# Patient Record
Sex: Female | Born: 1995
Health system: Southern US, Community
[De-identification: ages and names within clinical notes are randomized; demographics above are authoritative.]

## PROBLEM LIST (undated history)

## (undated) DIAGNOSIS — F32A Depression, unspecified: Secondary | ICD-10-CM

## (undated) DIAGNOSIS — F909 Attention-deficit hyperactivity disorder, unspecified type: Secondary | ICD-10-CM

## (undated) DIAGNOSIS — F419 Anxiety disorder, unspecified: Secondary | ICD-10-CM

## (undated) DIAGNOSIS — F329 Major depressive disorder, single episode, unspecified: Secondary | ICD-10-CM

## (undated) HISTORY — DX: Depression, unspecified: F32.A

## (undated) HISTORY — DX: Anxiety disorder, unspecified: F41.9

## (undated) HISTORY — DX: Major depressive disorder, single episode, unspecified: F32.9

## (undated) HISTORY — DX: Attention-deficit hyperactivity disorder, unspecified type: F90.9

---
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
## 2016-07-31 ENCOUNTER — Encounter: Payer: Self-pay | Admitting: Emergency Medicine

## 2016-07-31 ENCOUNTER — Emergency Department: Payer: Managed Care, Other (non HMO)

## 2016-07-31 ENCOUNTER — Emergency Department
Admission: EM | Admit: 2016-07-31 | Discharge: 2016-07-31 | Disposition: A | Discharge status: Home / Self Care | Payer: Managed Care, Other (non HMO) | Attending: Emergency Medicine | Admitting: Emergency Medicine

## 2016-07-31 DIAGNOSIS — Y929 Unspecified place or not applicable: Secondary | ICD-10-CM | POA: Diagnosis not present

## 2016-07-31 DIAGNOSIS — Z791 Long term (current) use of non-steroidal anti-inflammatories (NSAID): Secondary | ICD-10-CM | POA: Diagnosis not present

## 2016-07-31 DIAGNOSIS — Y9389 Activity, other specified: Secondary | ICD-10-CM | POA: Insufficient documentation

## 2016-07-31 DIAGNOSIS — Y999 Unspecified external cause status: Secondary | ICD-10-CM | POA: Diagnosis not present

## 2016-07-31 DIAGNOSIS — S62521A Displaced fracture of distal phalanx of right thumb, initial encounter for closed fracture: Secondary | ICD-10-CM | POA: Insufficient documentation

## 2016-07-31 DIAGNOSIS — S61308A Unspecified open wound of other finger with damage to nail, initial encounter: Secondary | ICD-10-CM | POA: Diagnosis not present

## 2016-07-31 DIAGNOSIS — W230XXA Caught, crushed, jammed, or pinched between moving objects, initial encounter: Secondary | ICD-10-CM | POA: Diagnosis not present

## 2016-07-31 DIAGNOSIS — S61309A Unspecified open wound of unspecified finger with damage to nail, initial encounter: Secondary | ICD-10-CM

## 2016-07-31 DIAGNOSIS — S6991XA Unspecified injury of right wrist, hand and finger(s), initial encounter: Secondary | ICD-10-CM

## 2016-07-31 MED ORDER — OXYCODONE-ACETAMINOPHEN 5-325 MG PO TABS
1.0000 | ORAL_TABLET | Freq: Once | ORAL | Status: AC
Start: 2016-07-31 — End: 2016-07-31
  Administered 2016-07-31: 1 via ORAL
  Filled 2016-07-31: qty 1

## 2016-07-31 MED ORDER — OXYCODONE-ACETAMINOPHEN 5-325 MG PO TABS
1.0000 | ORAL_TABLET | Freq: Once | ORAL | Status: AC
  Administered 2016-07-31: 1 via ORAL
  Filled 2016-07-31: qty 1

## 2016-07-31 MED ORDER — OXYCODONE-ACETAMINOPHEN 5-325 MG PO TABS
1.0000 | ORAL_TABLET | Freq: Four times a day (QID) | ORAL | 0 refills | Status: DC | PRN
Start: 2016-07-31 — End: 2018-05-10

## 2016-07-31 MED ORDER — LIDOCAINE HCL (PF) 1 % IJ SOLN
10.0000 mL | Freq: Once | INTRAMUSCULAR | Status: AC
  Administered 2016-07-31: 10 mL
  Filled 2016-07-31: qty 10

## 2016-07-31 MED ORDER — LIDOCAINE-EPINEPHRINE-TETRACAINE (LET) SOLUTION
3.0000 mL | Freq: Once | NASAL | Status: AC
  Administered 2016-07-31: 3 mL via TOPICAL

## 2016-07-31 MED ORDER — LIDOCAINE-EPINEPHRINE-TETRACAINE (LET) SOLUTION
NASAL | Status: AC
  Administered 2016-07-31: 3 mL via TOPICAL
  Filled 2016-07-31: qty 3

## 2016-07-31 NOTE — ED Triage Notes (Signed)
Slammed right thumb in car door. Pt anxious in triage. Thumb nail to right hand appears to be off. Bleeding.

## 2016-07-31 NOTE — ED Notes (Signed)
Pt splint was applied. Pt was given education about splint and what adverse side affects to look for from splinting process. Mother in room verbalized understanding. Pt pulse and cap.refill within normal limits. RN notifed of completion

## 2016-07-31 NOTE — ED Provider Notes (Signed)
[facility] Emergency Department Provider Note  ____________________________________________  Time seen: Approximately 5:10 PM  I have reviewed the triage vital signs and the nursing notes.   HISTORY  Chief Complaint Hand Injury    HPI Gina Sparks is a 20 y.o. female who presents emergency department complaining of injury to the right thumb. Patient states that she accidentally closed her car door on her thumb. Patient reports that there is significant damage to the nail. Pain is intense at this time. No medications prior to arrival. No other injury or complaint at this time.   History reviewed. No pertinent past medical history.  There are no active problems to display for this patient.   History reviewed. No pertinent surgical history.  Prior to Admission medications   Medication Sig Start Date End Date Taking? Authorizing Provider  ibuprofen (ADVIL,MOTRIN) 600 MG tablet Take 1 tablet (600 mg total) by mouth every 6 (six) hours as needed. 10/16/15   Chinita Pester, FNP  oxyCODONE-acetaminophen (ROXICET) 5-325 MG tablet Take 1 tablet by mouth every 6 (six) hours as needed for severe pain. 07/31/16   Delorise Royals Kala Ambriz, PA-C  traMADol (ULTRAM) 50 MG tablet Take 1 tablet (50 mg total) by mouth every 6 (six) hours as needed. 10/16/15   Chinita Pester, FNP    Allergies Review of patient's allergies indicates no known allergies.  History reviewed. No pertinent family history.  Social History Social History  Substance Use Topics  . Smoking status: Never Smoker  . Smokeless tobacco: Not on file  . Alcohol use No     Review of Systems  Constitutional: No fever/chills Cardiovascular: no chest pain. Respiratory: no cough. No SOB. Musculoskeletal: Positive for injury to the right thumb Skin: Negative for rash, abrasions, lacerations, ecchymosis. Neurological: Negative for headaches, focal weakness or numbness. 10-point ROS otherwise  negative.  ____________________________________________   PHYSICAL EXAM:  VITAL SIGNS: ED Triage Vitals  Enc Vitals Group     BP 07/31/16 1616 134/69     Pulse Rate 07/31/16 1616 71     Resp 07/31/16 1616 16     Temp 07/31/16 1618 98 F (36.7 C)     Temp Source 07/31/16 1618 Oral     SpO2 07/31/16 1616 100 %     Weight 07/31/16 1616 120 lb (54.4 kg)     Height 07/31/16 1616 5\' 3"  (1.6 m)     Head Circumference --      Peak Flow --      Pain Score 07/31/16 1616 6     Pain Loc --      Pain Edu? --      Excl. in GC? --      Constitutional: Alert and oriented. Well appearing and in no acute distress. Eyes: Conjunctivae are normal. PERRL. EOMI. Head: Atraumatic. Cardiovascular: Normal rate, regular rhythm. Normal S1 and S2.  Good peripheral circulation. Respiratory: Normal respiratory effort without tachypnea or retractions. Lungs CTAB. Good air entry to the bases with no decreased or absent breath sounds. Musculoskeletal: Full range of motion to all extremities. No gross deformities appreciated.Edema and ecchymosis is noted to the distal aspect of the right thumb. Obvious damage to the patient's nail and nailbed. Bleeding is controlled at this time. No visible foreign body. Patient does have good range of motion to the thumb. Patient is extremely tender palpation of the distal aspect of the thumb. Neurologic:  Normal speech and language. No gross focal neurologic deficits are appreciated.  Skin:  Skin is warm, dry and intact. No rash noted. Psychiatric: Mood and affect are normal. Speech and behavior are normal. Patient exhibits appropriate insight and judgement.   ____________________________________________   LABS (all labs ordered are listed, but only abnormal results are displayed)  Labs Reviewed - No data to display ____________________________________________  EKG   ____________________________________________  RADIOLOGY Festus Barren Nikko Quast, personally  viewed and evaluated these images (plain radiographs) as part of my medical decision making, as well as reviewing the written report by the radiologist.  Dg Hand Complete Right  Result Date: 07/31/2016 CLINICAL DATA:  Slammed right thumb in car door. Bleeding and pain. Initial encounter. EXAM: RIGHT HAND - COMPLETE 3+ VIEW COMPARISON:  None. FINDINGS: There is a small, minimally displaced fracture involving the tuft of the distal phalanx of the thumb. There is overlying soft tissue swelling. No dislocation, focal osseous lesion, or significant arthropathic changes identified. IMPRESSION: Minimally displaced distal tuft fracture of the right thumb. Electronically Signed   By: Sebastian Ache M.D.   On: 07/31/2016 17:38    ____________________________________________    PROCEDURES  Procedure(s) performed:    .Nail Removal Date/Time: 07/31/2016 7:57 PM Performed by: Gala Romney D Authorized by: Gala Romney D   Consent:    Consent obtained:  Verbal   Consent given by:  Patient   Risks discussed:  Bleeding, pain and permanent nail deformity Location:    Hand:  R thumb Pre-procedure details:    Skin preparation:  Betadine Anesthesia (see MAR for exact dosages):    Anesthesia method:  Nerve block   Block needle gauge:  27 G   Block anesthetic:  Lidocaine 1% w/o epi   Block technique:  Digital block   Block injection procedure:  Anatomic landmarks identified, introduced needle and negative aspiration for blood   Block outcome:  Anesthesia achieved Nail Removal:    Nail removed:  Complete Post-procedure details:    Dressing:  Non-adhesive packing strip and tube gauze Comments:     Digital block is performed with good anesthesia of the right thumb. Nail successfully removed. There was significant underlying trauma from injury. Hemostasis was obtained using LET, direct pressure, Surgicel, electrical cautery. Patient tolerated well with no cough medications. Patient's finger  is splinted both due to underlying tuft fracture as well as for protection of finger.      Medications  oxyCODONE-acetaminophen (PERCOCET/ROXICET) 5-325 MG per tablet 1 tablet (1 tablet Oral Given 07/31/16 1714)  lidocaine (PF) (XYLOCAINE) 1 % injection 10 mL (10 mLs Infiltration Given 07/31/16 1714)  lidocaine-EPINEPHrine-tetracaine (LET) solution (3 mLs Topical Given 07/31/16 1920)  oxyCODONE-acetaminophen (PERCOCET/ROXICET) 5-325 MG per tablet 1 tablet (1 tablet Oral Given 07/31/16 1920)     ____________________________________________   INITIAL IMPRESSION / ASSESSMENT AND PLAN / ED COURSE  Pertinent labs & imaging results that were available during my care of the patient were reviewed by me and considered in my medical decision making (see chart for details).  Review of the [place] CSRS was performed in accordance of the [facility] prior to dispensing any controlled drugs.  Clinical Course    Patient's diagnosis is consistent with Finger injury with nail avulsion. Patient accidentally shut car door on finger, patient presents with obvious trauma to the nailbed and nail. This is successfully removed. Patient did experience some bleeding due to the nature of the soft tissue underlying. This was successfully ceased using let, there are pressure, Surgicel, electrical cautery. Patient's finger is splinted prior to discharge from emergency Department.. Patient will  be discharged home with prescriptions for pain medication due to the significant soft tissue trauma.. Patient is to follow up with primary care or orthopedic surgery as needed or otherwise directed. Patient is given ED precautions to return to the ED for any worsening or new symptoms.     ____________________________________________  FINAL CLINICAL IMPRESSION(S) / ED DIAGNOSES  Final diagnoses:  Hand injury, right, initial encounter  Traumatic avulsion of nail plate of finger, initial encounter      NEW MEDICATIONS STARTED DURING  THIS VISIT:  Discharge Medication List as of 07/31/2016  7:29 PM    START taking these medications   Details  oxyCODONE-acetaminophen (ROXICET) 5-325 MG tablet Take 1 tablet by mouth every 6 (six) hours as needed for severe pain., Starting Sat 07/31/2016, Print            This chart was dictated using voice recognition software/Dragon. Despite best efforts to proofread, errors can occur which can change the meaning. Any change was purely unintentional.    Racheal PatchesJonathan D Kristian Hazzard, PA-C 07/31/16 2001    Phineas SemenGraydon Goodman, MD 07/31/16 [phone]

## 2016-07-31 NOTE — ED Notes (Signed)
Pt slammed right thumb in car door, motor intact, sensation intact, thumb nail appears to be separating from nail bed, bleeding controlled at this time

## 2016-08-04 ENCOUNTER — Emergency Department
Admission: EM | Admit: 2016-08-04 | Discharge: 2016-08-04 | Disposition: A | Discharge status: Home / Self Care | Payer: Managed Care, Other (non HMO)

## 2016-08-04 NOTE — ED Triage Notes (Addendum)
Patient ambulatory to triage with steady gait, without difficulty or distress noted; pt reports here Saturday for closing right thumb in car door; xray was negative; told to return for bleeding and has noted such; no bleeding at present and pt & mother st that they will just wait and go see PCP as instructed in the morning or return here

## 2016-08-05 DIAGNOSIS — S60111A Contusion of right thumb with damage to nail, initial encounter: Secondary | ICD-10-CM | POA: Diagnosis not present

## 2016-08-31 DIAGNOSIS — F411 Generalized anxiety disorder: Secondary | ICD-10-CM | POA: Insufficient documentation

## 2016-08-31 DIAGNOSIS — F332 Major depressive disorder, recurrent severe without psychotic features: Secondary | ICD-10-CM | POA: Diagnosis present

## 2016-08-31 DIAGNOSIS — F331 Major depressive disorder, recurrent, moderate: Secondary | ICD-10-CM | POA: Insufficient documentation

## 2016-09-25 ENCOUNTER — Encounter: Payer: Self-pay | Admitting: Emergency Medicine

## 2016-09-25 ENCOUNTER — Emergency Department
Admission: EM | Admit: 2016-09-25 | Discharge: 2016-09-25 | Disposition: A | Discharge status: Home / Self Care | Payer: Managed Care, Other (non HMO) | Attending: Student in an Organized Health Care Education/Training Program | Admitting: Student in an Organized Health Care Education/Training Program

## 2016-09-25 DIAGNOSIS — Z139 Encounter for screening, unspecified: Secondary | ICD-10-CM

## 2016-09-25 DIAGNOSIS — T7621XA Adult sexual abuse, suspected, initial encounter: Secondary | ICD-10-CM | POA: Diagnosis present

## 2016-09-25 DIAGNOSIS — Z0000 Encounter for general adult medical examination without abnormal findings: Secondary | ICD-10-CM | POA: Diagnosis not present
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
## 2016-09-25 NOTE — ED Notes (Signed)
AAOx3.  Skin warm and dry.  NAD 
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
## 2016-09-25 NOTE — ED Notes (Signed)
Melissa, rn with [facility] here to speak with pt.

## 2016-09-25 NOTE — Discharge Instructions (Signed)
Follow-up with PCP. Return for any concerns.

## 2016-09-25 NOTE — ED Notes (Signed)
Pt states she is in therapy and prescribed a mood stabilizer which she hasn't taken yet.
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
## 2016-09-25 NOTE — ED Notes (Signed)
Spoke with ginger from [facility]. Someone will come up and see pt.
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
## 2018-01-09 ENCOUNTER — Encounter: Payer: Self-pay | Admitting: Obstetrics and Gynecology

## 2018-01-17 ENCOUNTER — Ambulatory Visit: Payer: Self-pay | Admitting: Obstetrics and Gynecology

## 2018-04-17 ENCOUNTER — Telehealth: Payer: Self-pay

## 2018-04-17 NOTE — Telephone Encounter (Signed)
Pt calling c questions.  [phone]  Left detailed msg as far as I can tell we have not seen her.  Therefore, I am not allowed to adv her.  To sched appt.

## 2018-05-10 ENCOUNTER — Ambulatory Visit (INDEPENDENT_AMBULATORY_CARE_PROVIDER_SITE_OTHER): Payer: BLUE CROSS/BLUE SHIELD | Admitting: Obstetrics and Gynecology

## 2018-05-10 ENCOUNTER — Encounter: Payer: Self-pay | Admitting: Obstetrics and Gynecology

## 2018-05-10 ENCOUNTER — Other Ambulatory Visit (HOSPITAL_COMMUNITY)
Admission: RE | Admit: 2018-05-10 | Discharge: 2018-05-10 | Disposition: A | Discharge status: Home / Self Care | Payer: BLUE CROSS/BLUE SHIELD | Source: Ambulatory Visit | Attending: Obstetrics and Gynecology | Admitting: Obstetrics and Gynecology

## 2018-05-10 VITALS — BP 114/62 | HR 99 | Ht 63.0 in | Wt 120.0 lb

## 2018-05-10 DIAGNOSIS — N898 Other specified noninflammatory disorders of vagina: Secondary | ICD-10-CM | POA: Diagnosis not present

## 2018-05-10 DIAGNOSIS — N941 Unspecified dyspareunia: Secondary | ICD-10-CM | POA: Diagnosis not present

## 2018-05-10 DIAGNOSIS — Z113 Encounter for screening for infections with a predominantly sexual mode of transmission: Secondary | ICD-10-CM

## 2018-05-10 LAB — POCT WET PREP WITH KOH
Clue Cells Wet Prep HPF POC: NEGATIVE
KOH PREP POC: NEGATIVE
Trichomonas, UA: NEGATIVE
YEAST WET PREP PER HPF POC: NEGATIVE

## 2018-05-10 MED ORDER — TERCONAZOLE 0.8 % VA CREA: 1.0000 | TOPICAL_CREAM | Freq: Every day | VAGINAL | 0 refills | Status: AC
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
## 2018-05-10 NOTE — Patient Instructions (Signed)
I value your feedback and entrusting us with your care. If you get a [facility] patient survey, I would appreciate you taking the time to let us know about your experience today. Thank you! 

## 2018-05-12 DIAGNOSIS — F3181 Bipolar II disorder: Secondary | ICD-10-CM | POA: Insufficient documentation

## 2018-05-12 LAB — CERVICOVAGINAL ANCILLARY ONLY
CHLAMYDIA, DNA PROBE: NEGATIVE
Neisseria Gonorrhea: NEGATIVE
Trichomonas: NEGATIVE

## 2018-06-18 ENCOUNTER — Emergency Department
Admission: EM | Admit: 2018-06-18 | Discharge: 2018-06-19 | Disposition: A | Discharge status: Home / Self Care | Payer: BLUE CROSS/BLUE SHIELD | Attending: Student in an Organized Health Care Education/Training Program | Admitting: Student in an Organized Health Care Education/Training Program

## 2018-06-18 ENCOUNTER — Other Ambulatory Visit: Payer: Self-pay

## 2018-06-18 DIAGNOSIS — F17290 Nicotine dependence, other tobacco product, uncomplicated: Secondary | ICD-10-CM | POA: Insufficient documentation

## 2018-06-18 DIAGNOSIS — R45851 Suicidal ideations: Secondary | ICD-10-CM | POA: Insufficient documentation

## 2018-06-18 DIAGNOSIS — F329 Major depressive disorder, single episode, unspecified: Secondary | ICD-10-CM | POA: Diagnosis not present

## 2018-06-18 DIAGNOSIS — F3181 Bipolar II disorder: Secondary | ICD-10-CM | POA: Insufficient documentation

## 2018-06-18 DIAGNOSIS — Z046 Encounter for general psychiatric examination, requested by authority: Secondary | ICD-10-CM | POA: Insufficient documentation

## 2018-06-18 DIAGNOSIS — F32A Depression, unspecified: Secondary | ICD-10-CM

## 2018-06-18 DIAGNOSIS — R4586 Emotional lability: Secondary | ICD-10-CM | POA: Diagnosis not present

## 2018-06-18 DIAGNOSIS — R5381 Other malaise: Secondary | ICD-10-CM | POA: Diagnosis not present

## 2018-06-18 LAB — CBC
HEMATOCRIT: 40.4 % (ref 35.0–47.0)
HEMOGLOBIN: 13.9 g/dL (ref 12.0–16.0)
MCH: 30.6 pg (ref 26.0–34.0)
MCHC: 34.5 g/dL (ref 32.0–36.0)
MCV: 88.8 fL (ref 80.0–100.0)
Platelets: 261 10*3/uL (ref 150–440)
RBC: 4.55 MIL/uL (ref 3.80–5.20)
RDW: 13 % (ref 11.5–14.5)
WBC: 7.4 10*3/uL (ref 3.6–11.0)

## 2018-06-18 LAB — COMPREHENSIVE METABOLIC PANEL
ALK PHOS: 32 U/L — AB (ref 38–126)
ALT: 12 U/L (ref 0–44)
AST: 16 U/L (ref 15–41)
Albumin: 4.5 g/dL (ref 3.5–5.0)
Anion gap: 4 — ABNORMAL LOW (ref 5–15)
BUN: 11 mg/dL (ref 6–20)
CALCIUM: 8.7 mg/dL — AB (ref 8.9–10.3)
CO2: 26 mmol/L (ref 22–32)
CREATININE: 0.76 mg/dL (ref 0.44–1.00)
Chloride: 109 mmol/L (ref 98–111)
GFR calc Af Amer: >60 mL/min (ref >60)
GFR calc non Af Amer: >60 mL/min (ref >60)
Glucose, Bld: 103 mg/dL — ABNORMAL HIGH (ref 70–99)
Potassium: 4.2 mmol/L (ref 3.5–5.1)
SODIUM: 139 mmol/L (ref 135–145)
Total Bilirubin: 0.5 mg/dL (ref 0.3–1.2)
Total Protein: 7.6 g/dL (ref 6.5–8.1)

## 2018-06-18 LAB — ACETAMINOPHEN LEVEL: Acetaminophen (Tylenol), Serum: <10 ug/mL — ABNORMAL LOW (ref 10–30)

## 2018-06-18 LAB — URINE DRUG SCREEN, QUALITATIVE (ARMC ONLY)
Amphetamines, Ur Screen: NOT DETECTED
BARBITURATES, UR SCREEN: NOT DETECTED
COCAINE METABOLITE, UR ~~LOC~~: NOT DETECTED
Cannabinoid 50 Ng, Ur ~~LOC~~: POSITIVE — AB
MDMA (ECSTASY) UR SCREEN: NOT DETECTED
Methadone Scn, Ur: NOT DETECTED
OPIATE, UR SCREEN: NOT DETECTED
PHENCYCLIDINE (PCP) UR S: NOT DETECTED
TRICYCLIC, UR SCREEN: NOT DETECTED

## 2018-06-18 LAB — POCT PREGNANCY, URINE: Preg Test, Ur: NEGATIVE

## 2018-06-18 LAB — SALICYLATE LEVEL

## 2018-06-18 LAB — ETHANOL: Alcohol, Ethyl (B): <10 mg/dL (ref <10)

## 2018-06-18 MED ORDER — NICOTINE 21 MG/24HR TD PT24
21.0000 mg | MEDICATED_PATCH | Freq: Every day | TRANSDERMAL | Status: DC
  Administered 2018-06-18: 21 mg via TRANSDERMAL
  Filled 2018-06-18: qty 1

## 2018-06-18 MED ORDER — ZIPRASIDONE MESYLATE 20 MG IM SOLR: 20.0000 mg | Freq: Once | INTRAMUSCULAR | Status: DC

## 2018-06-18 MED ORDER — IBUPROFEN 600 MG PO TABS
600.0000 mg | ORAL_TABLET | Freq: Four times a day (QID) | ORAL | Status: DC | PRN
  Administered 2018-06-18: 600 mg via ORAL
  Filled 2018-06-18: qty 1

## 2018-06-18 NOTE — ED Triage Notes (Signed)
Pt states chronic hx of SI w/o specific plan. Pt states increased stress, depression. Pt states she slept 30 hours in the past 2 days. Pt has a litany of complaints that span mental health and gyn difficulties. Pt has hx of SI and several other mental health difficulties. Pt is pale and tearful.

## 2018-06-18 NOTE — ED Provider Notes (Signed)
[facility] Emergency Department Provider Note    First MD Initiated Contact with Patient 06/18/18 1746     (approximate)  I have reviewed the triage vital signs and the nursing notes.   HISTORY  Chief Complaint Depression and Suicidal    HPI BRICIA TAHER is a 22 y.o. female history of anxiety and depression presents the ER due to increased stress feelings of depression and SI.  States she does not have a plan but states that she wishes that she could go "sleep forever".  Is not currently on any psych meds.  States that she slept for over 30hours.  Feels that her symptoms are becoming significantly worse to the point where she is having difficulty functioning at home.  Any HI or hallucinations.  Denies any pain.    Past Medical History:  Diagnosis Date  . ADHD   . Anxiety and depression    Family History  Problem Relation Age of Onset  . Anxiety disorder Father   . Depression Father   . Anxiety disorder Maternal Grandmother   . Depression Maternal Grandmother   . Dementia Maternal Grandfather   . Breast cancer Paternal Grandmother 2  . Dementia Paternal Grandfather    No past surgical history on file. Patient Active Problem List   Diagnosis Date Noted  . Dyspareunia in female 05/10/2018      Prior to Admission medications   Medication Sig Start Date End Date Taking? Authorizing Provider  etonogestrel (NEXPLANON) 68 MG IMPL implant Inject into the skin.    [provider]  ibuprofen (ADVIL,MOTRIN) 600 MG tablet Take 1 tablet (600 mg total) by mouth every 6 (six) hours as needed. 10/16/15   Chinita Pester, FNP    Allergies Oxycodone-acetaminophen    Social History Social History   Tobacco Use  . Smoking status: Current Every Day Smoker    Types: E-cigarettes  . Smokeless tobacco: Never Used  Substance Use Topics  . Alcohol use: Yes  . Drug use: Yes    Types: Marijuana    Review of Systems Patient denies  headaches, rhinorrhea, blurry vision, numbness, shortness of breath, chest pain, edema, cough, abdominal pain, nausea, vomiting, diarrhea, dysuria, fevers, rashes or hallucinations unless otherwise stated above in HPI. ____________________________________________   PHYSICAL EXAM:  VITAL SIGNS: Vitals:   06/18/18 1659  BP: (!) 131/97  Pulse: 75  Resp: 16  Temp: 98.9 F (37.2 C)  SpO2: 100%   Constitutional: Alert and oriented.  Eyes: Conjunctivae are normal.  Head: Atraumatic. Nose: No congestion/rhinnorhea. Mouth/Throat: Mucous membranes are moist.   Neck: No stridor. Painless ROM.  Cardiovascular: Normal rate, regular rhythm. Grossly normal heart sounds.  Good peripheral circulation. Respiratory: Normal respiratory effort.  No retractions. Lungs CTAB. Gastrointestinal: Soft and nontender. No distention. No abdominal bruits. No CVA tenderness. Genitourinary:  Musculoskeletal: No lower extremity tenderness nor edema.  No joint effusions. Neurologic:  Normal speech and language. No gross focal neurologic deficits are appreciated. No facial droop Skin:  Skin is warm, dry and intact. No rash noted. Psychiatric: Mood and affect are normal. Speech and behavior are normal.  ____________________________________________   LABS (all labs ordered are listed, but only abnormal results are displayed)  Results for orders placed or performed during the hospital encounter of 06/18/18 (from the past 24 hour(s))  Comprehensive metabolic panel     Status: Abnormal   Collection Time: 06/18/18  5:22 PM  Result Value Ref Range   Sodium 139 135 - 145  mmol/L   Potassium 4.2 3.5 - 5.1 mmol/L   Chloride 109 98 - 111 mmol/L   CO2 26 22 - 32 mmol/L   Glucose, Bld 103 (H) 70 - 99 mg/dL   BUN 11 6 - 20 mg/dL   Creatinine, Ser 4.090.76 0.44 - 1.00 mg/dL   Calcium 8.7 (L) 8.9 - 10.3 mg/dL   Total Protein 7.6 6.5 - 8.1 g/dL   Albumin 4.5 3.5 - 5.0 g/dL   AST 16 15 - 41 U/L   ALT 12 0 - 44 U/L    Alkaline Phosphatase 32 (L) 38 - 126 U/L   Total Bilirubin 0.5 0.3 - 1.2 mg/dL   GFR calc non Af Amer >60 >60 mL/min   GFR calc Af Amer >60 >60 mL/min   Anion gap 4 (L) 5 - 15  Ethanol     Status: None   Collection Time: 06/18/18  5:22 PM  Result Value Ref Range   Alcohol, Ethyl (B) <10 <10 mg/dL  cbc     Status: None   Collection Time: 06/18/18  5:22 PM  Result Value Ref Range   WBC 7.4 3.6 - 11.0 K/uL   RBC 4.55 3.80 - 5.20 MIL/uL   Hemoglobin 13.9 12.0 - 16.0 g/dL   HCT 81.140.4 91.435.0 - 78.247.0 %   MCV 88.8 80.0 - 100.0 fL   MCH 30.6 26.0 - 34.0 pg   MCHC 34.5 32.0 - 36.0 g/dL   RDW 95.613.0 21.311.5 - 08.614.5 %   Platelets 261 150 - 440 K/uL  Pregnancy, urine POC     Status: None   Collection Time: 06/18/18  5:37 PM  Result Value Ref Range   Preg Test, Ur NEGATIVE NEGATIVE   ____________________________________________ ____________________________________________  RADIOLOGY   ____________________________________________   PROCEDURES  Procedure(s) performed:  Procedures    Critical Care performed: no ____________________________________________   INITIAL IMPRESSION / ASSESSMENT AND PLAN / ED COURSE  Pertinent labs & imaging results that were available during my care of the patient were reviewed by me and considered in my medical decision making (see chart for details).   DDX: Psychosis, delirium, medication effect, noncompliance, polysubstance abuse, Si, Hi, depression   Lauris Poagmelia E Rosalyn Chartersanes is a 22 y.o. who presents to the ED with for evaluation of SI and depression.  Patient has psych history of anxiety and depression.  Laboratory testing was ordered to evaluation for underlying electrolyte derangement or signs of underlying organic pathology to explain today's presentation.  Based on history and physical and laboratory evaluation, it appears that the patient's presentation is 2/2 underlying psychiatric disorder and will require further evaluation and management by inpatient  psychiatry.  Patient was  made an IVC due to SI and severe depression.  Disposition pending psychiatric evaluation.       As part of my medical decision making, I reviewed the following data within the electronic MEDICAL RECORD NUMBER Nursing notes reviewed and incorporated, Labs reviewed, notes from prior ED visits.  ____________________________________________   FINAL CLINICAL IMPRESSION(S) / ED DIAGNOSES  Final diagnoses:  Depression, unspecified depression type  Suicidal ideation      NEW MEDICATIONS STARTED DURING THIS VISIT:  New Prescriptions   No medications on file     Note:  This document was prepared using Dragon voice recognition software and may include unintentional dictation errors.    Willy Eddyobinson, Richardine Peppers, MD 06/18/18 [id number]

## 2018-06-18 NOTE — ED Notes (Signed)
Pt dressed out by this tech and Enbridge EnergyBrittney, tech. Pt belongings include: pair of adidas shoes, jean shorts, underwear and calvin klein long sleeve shirt. Jewelry, phone and hairbow went home with Dad.

## 2018-06-18 NOTE — ED Notes (Signed)
Pt. Transferred to BHU from ED to room after screening for contraband. Report to include Situation, Background, Assessment and Recommendations from RN Claris CheMargaret. Pt. Oriented to unit including Q15 minute rounds as well as the security cameras for their protection. Patient is alert and oriented, warm and dry in no acute distress. Patient denies SI, HI, and AVH. Pt. Encouraged to let me know if needs arise.

## 2018-06-18 NOTE — ED Notes (Signed)
Patient assigned to appropriate care area   Introduced self to pt  Patient oriented to unit/care area: Informed that, for their safety, care areas are designed for safety and visiting and phone hours explained to patient. Patient verbalizes understanding, and verbal contract for safety obtained  Environment secured   Pt discussed chronic hx of SI w/o specific plan. Pt states increased stress, depression. Pt states she slept 30 hours in the past 2 days. Pt has hx of SI and several other mental health difficulties. Pt is tearful.

## 2018-06-18 NOTE — ED Notes (Signed)
Dad's number: [phone]

## 2018-06-18 NOTE — ED Notes (Signed)
Gave pt sandwich tray and drink. 

## 2018-06-19 NOTE — ED Notes (Signed)
[facility] said she can be discharged to her guardian.

## 2018-06-19 NOTE — ED Provider Notes (Signed)
-----------------------------------------   3:21 AM on 06/19/2018 -----------------------------------------   Blood pressure 117/80, pulse 70, temperature (!) 97.3 F (36.3 C), temperature source Oral, resp. rate 16, height 1.6 m (5\' 3" ), weight 55.8 kg, SpO2 100 %.  The patient had no acute events since last update.  Calm and cooperative at this time.  Telepsych evaluated the patient and reverse the involuntary commitment and feels she is safe to go home with family and follow-up as an outpatient.  [facility].    Loleta RoseForbach, Shaundra Fullam, MD 06/19/18 [id number]

## 2018-06-19 NOTE — Discharge Instructions (Signed)

## 2018-06-19 NOTE — ED Notes (Signed)
Hourly rounding reveals patient in room. No complaints, stable, in no acute distress. Q15 minute rounds and monitoring via Security Cameras to continue. 

## 2018-06-19 NOTE — ED Notes (Signed)
[facility]

## 2018-06-19 NOTE — ED Notes (Signed)
[facility] She is taken by CNA and Technical sales engineerofficer.

## 2018-06-23 DIAGNOSIS — F4310 Post-traumatic stress disorder, unspecified: Secondary | ICD-10-CM | POA: Diagnosis not present

## 2018-07-03 DIAGNOSIS — F4310 Post-traumatic stress disorder, unspecified: Secondary | ICD-10-CM | POA: Diagnosis not present

## 2018-07-10 DIAGNOSIS — F4310 Post-traumatic stress disorder, unspecified: Secondary | ICD-10-CM | POA: Diagnosis not present

## 2018-07-17 DIAGNOSIS — F4310 Post-traumatic stress disorder, unspecified: Secondary | ICD-10-CM | POA: Diagnosis not present

## 2018-07-25 DIAGNOSIS — F3181 Bipolar II disorder: Secondary | ICD-10-CM | POA: Diagnosis not present

## 2018-07-31 DIAGNOSIS — F3181 Bipolar II disorder: Secondary | ICD-10-CM | POA: Diagnosis not present

## 2018-08-07 DIAGNOSIS — F3181 Bipolar II disorder: Secondary | ICD-10-CM | POA: Diagnosis not present

## 2018-08-14 DIAGNOSIS — F3181 Bipolar II disorder: Secondary | ICD-10-CM | POA: Diagnosis not present

## 2018-08-16 DIAGNOSIS — F310 Bipolar disorder, current episode hypomanic: Secondary | ICD-10-CM | POA: Diagnosis not present

## 2018-08-21 DIAGNOSIS — F4310 Post-traumatic stress disorder, unspecified: Secondary | ICD-10-CM | POA: Diagnosis not present

## 2018-08-28 DIAGNOSIS — F331 Major depressive disorder, recurrent, moderate: Secondary | ICD-10-CM | POA: Diagnosis not present

## 2018-08-28 DIAGNOSIS — F4310 Post-traumatic stress disorder, unspecified: Secondary | ICD-10-CM | POA: Diagnosis not present

## 2018-09-05 DIAGNOSIS — F3110 Bipolar disorder, current episode manic without psychotic features, unspecified: Secondary | ICD-10-CM | POA: Diagnosis not present

## 2018-09-06 DIAGNOSIS — F3181 Bipolar II disorder: Secondary | ICD-10-CM | POA: Diagnosis not present

## 2018-09-11 DIAGNOSIS — F3181 Bipolar II disorder: Secondary | ICD-10-CM | POA: Diagnosis not present

## 2018-09-18 DIAGNOSIS — F3181 Bipolar II disorder: Secondary | ICD-10-CM | POA: Diagnosis not present

## 2018-09-26 DIAGNOSIS — F3181 Bipolar II disorder: Secondary | ICD-10-CM | POA: Diagnosis not present

## 2018-09-27 DIAGNOSIS — F3181 Bipolar II disorder: Secondary | ICD-10-CM | POA: Diagnosis not present

## 2018-10-02 DIAGNOSIS — F3181 Bipolar II disorder: Secondary | ICD-10-CM | POA: Diagnosis not present

## 2018-10-09 DIAGNOSIS — F3181 Bipolar II disorder: Secondary | ICD-10-CM | POA: Diagnosis not present

## 2018-10-12 DIAGNOSIS — F3181 Bipolar II disorder: Secondary | ICD-10-CM | POA: Diagnosis not present

## 2018-10-27 DIAGNOSIS — F411 Generalized anxiety disorder: Secondary | ICD-10-CM | POA: Diagnosis not present

## 2018-12-25 DIAGNOSIS — L02424 Furuncle of left upper limb: Secondary | ICD-10-CM | POA: Diagnosis not present

## 2018-12-25 DIAGNOSIS — L02423 Furuncle of right upper limb: Secondary | ICD-10-CM | POA: Diagnosis not present

## 2018-12-25 DIAGNOSIS — L309 Dermatitis, unspecified: Secondary | ICD-10-CM | POA: Diagnosis not present

## 2019-01-22 DIAGNOSIS — N946 Dysmenorrhea, unspecified: Secondary | ICD-10-CM | POA: Insufficient documentation

## 2019-01-22 DIAGNOSIS — N921 Excessive and frequent menstruation with irregular cycle: Secondary | ICD-10-CM | POA: Diagnosis not present

## 2019-01-22 DIAGNOSIS — Z79899 Other long term (current) drug therapy: Secondary | ICD-10-CM | POA: Insufficient documentation

## 2019-01-22 DIAGNOSIS — F3181 Bipolar II disorder: Secondary | ICD-10-CM | POA: Diagnosis not present

## 2019-01-29 DIAGNOSIS — L853 Xerosis cutis: Secondary | ICD-10-CM | POA: Diagnosis not present

## 2019-01-29 DIAGNOSIS — L02423 Furuncle of right upper limb: Secondary | ICD-10-CM | POA: Diagnosis not present

## 2019-01-29 DIAGNOSIS — L308 Other specified dermatitis: Secondary | ICD-10-CM | POA: Diagnosis not present

## 2019-01-29 DIAGNOSIS — L02424 Furuncle of left upper limb: Secondary | ICD-10-CM | POA: Diagnosis not present

## 2019-02-13 DIAGNOSIS — F314 Bipolar disorder, current episode depressed, severe, without psychotic features: Secondary | ICD-10-CM | POA: Diagnosis not present

## 2019-02-13 DIAGNOSIS — F411 Generalized anxiety disorder: Secondary | ICD-10-CM | POA: Diagnosis not present

## 2019-03-01 DIAGNOSIS — F314 Bipolar disorder, current episode depressed, severe, without psychotic features: Secondary | ICD-10-CM | POA: Diagnosis not present

## 2019-03-06 DIAGNOSIS — F314 Bipolar disorder, current episode depressed, severe, without psychotic features: Secondary | ICD-10-CM | POA: Diagnosis not present

## 2019-03-12 DIAGNOSIS — F411 Generalized anxiety disorder: Secondary | ICD-10-CM | POA: Diagnosis not present

## 2019-03-12 DIAGNOSIS — F3181 Bipolar II disorder: Secondary | ICD-10-CM | POA: Diagnosis not present

## 2019-03-12 DIAGNOSIS — N921 Excessive and frequent menstruation with irregular cycle: Secondary | ICD-10-CM | POA: Diagnosis not present

## 2019-03-12 DIAGNOSIS — F314 Bipolar disorder, current episode depressed, severe, without psychotic features: Secondary | ICD-10-CM | POA: Diagnosis not present

## 2019-03-13 DIAGNOSIS — F314 Bipolar disorder, current episode depressed, severe, without psychotic features: Secondary | ICD-10-CM | POA: Diagnosis not present

## 2019-03-20 DIAGNOSIS — F314 Bipolar disorder, current episode depressed, severe, without psychotic features: Secondary | ICD-10-CM | POA: Diagnosis not present

## 2019-03-28 DIAGNOSIS — F314 Bipolar disorder, current episode depressed, severe, without psychotic features: Secondary | ICD-10-CM | POA: Diagnosis not present

## 2019-04-02 DIAGNOSIS — F314 Bipolar disorder, current episode depressed, severe, without psychotic features: Secondary | ICD-10-CM | POA: Diagnosis not present

## 2019-04-02 DIAGNOSIS — F411 Generalized anxiety disorder: Secondary | ICD-10-CM | POA: Diagnosis not present

## 2019-04-04 DIAGNOSIS — F314 Bipolar disorder, current episode depressed, severe, without psychotic features: Secondary | ICD-10-CM | POA: Diagnosis not present

## 2019-04-12 DIAGNOSIS — F314 Bipolar disorder, current episode depressed, severe, without psychotic features: Secondary | ICD-10-CM | POA: Diagnosis not present

## 2019-04-18 DIAGNOSIS — F314 Bipolar disorder, current episode depressed, severe, without psychotic features: Secondary | ICD-10-CM | POA: Diagnosis not present

## 2019-04-25 DIAGNOSIS — F314 Bipolar disorder, current episode depressed, severe, without psychotic features: Secondary | ICD-10-CM | POA: Diagnosis not present

## 2019-05-01 DIAGNOSIS — F314 Bipolar disorder, current episode depressed, severe, without psychotic features: Secondary | ICD-10-CM | POA: Diagnosis not present

## 2019-05-12 DIAGNOSIS — Z20828 Contact with and (suspected) exposure to other viral communicable diseases: Secondary | ICD-10-CM | POA: Diagnosis not present

## 2019-05-22 DIAGNOSIS — F314 Bipolar disorder, current episode depressed, severe, without psychotic features: Secondary | ICD-10-CM | POA: Diagnosis not present

## 2019-05-29 DIAGNOSIS — F314 Bipolar disorder, current episode depressed, severe, without psychotic features: Secondary | ICD-10-CM | POA: Diagnosis not present

## 2019-06-01 DIAGNOSIS — F411 Generalized anxiety disorder: Secondary | ICD-10-CM | POA: Diagnosis not present

## 2019-06-01 DIAGNOSIS — F314 Bipolar disorder, current episode depressed, severe, without psychotic features: Secondary | ICD-10-CM | POA: Diagnosis not present

## 2019-06-02 DIAGNOSIS — S20319A Abrasion of unspecified front wall of thorax, initial encounter: Secondary | ICD-10-CM | POA: Diagnosis not present

## 2019-06-02 DIAGNOSIS — R110 Nausea: Secondary | ICD-10-CM | POA: Diagnosis not present

## 2019-06-02 DIAGNOSIS — G4489 Other headache syndrome: Secondary | ICD-10-CM | POA: Diagnosis not present

## 2019-06-02 DIAGNOSIS — R42 Dizziness and giddiness: Secondary | ICD-10-CM | POA: Diagnosis not present

## 2019-06-05 DIAGNOSIS — F314 Bipolar disorder, current episode depressed, severe, without psychotic features: Secondary | ICD-10-CM | POA: Diagnosis not present

## 2019-06-12 DIAGNOSIS — F314 Bipolar disorder, current episode depressed, severe, without psychotic features: Secondary | ICD-10-CM | POA: Diagnosis not present

## 2019-06-20 DIAGNOSIS — F314 Bipolar disorder, current episode depressed, severe, without psychotic features: Secondary | ICD-10-CM | POA: Diagnosis not present

## 2019-06-26 DIAGNOSIS — F314 Bipolar disorder, current episode depressed, severe, without psychotic features: Secondary | ICD-10-CM | POA: Diagnosis not present

## 2019-07-03 DIAGNOSIS — F314 Bipolar disorder, current episode depressed, severe, without psychotic features: Secondary | ICD-10-CM | POA: Diagnosis not present

## 2019-07-05 DIAGNOSIS — F314 Bipolar disorder, current episode depressed, severe, without psychotic features: Secondary | ICD-10-CM | POA: Diagnosis not present

## 2019-07-05 DIAGNOSIS — F411 Generalized anxiety disorder: Secondary | ICD-10-CM | POA: Diagnosis not present

## 2019-07-10 DIAGNOSIS — F314 Bipolar disorder, current episode depressed, severe, without psychotic features: Secondary | ICD-10-CM | POA: Diagnosis not present

## 2019-07-17 DIAGNOSIS — F314 Bipolar disorder, current episode depressed, severe, without psychotic features: Secondary | ICD-10-CM | POA: Diagnosis not present

## 2019-07-19 DIAGNOSIS — F314 Bipolar disorder, current episode depressed, severe, without psychotic features: Secondary | ICD-10-CM | POA: Diagnosis not present

## 2019-07-23 ENCOUNTER — Telehealth (HOSPITAL_COMMUNITY): Payer: Self-pay | Admitting: Psychiatry

## 2019-07-23 NOTE — Telephone Encounter (Signed)
D:  Pt phoned and left vm inquiring about MH-IOP.  States her therapist Advertising account planner [place], [place]) referred her.  A:  Returned pt's call to orient and provide her with a start date.  According to pt, her therapist will be going out on maternity leave soon; so that's probably when she will start [place].  Informed pt to call writer back when she's ready to start.  R:  Pt receptive.

## 2019-07-25 DIAGNOSIS — F314 Bipolar disorder, current episode depressed, severe, without psychotic features: Secondary | ICD-10-CM | POA: Diagnosis not present

## 2019-08-01 DIAGNOSIS — F314 Bipolar disorder, current episode depressed, severe, without psychotic features: Secondary | ICD-10-CM | POA: Diagnosis not present

## 2019-08-02 ENCOUNTER — Telehealth (HOSPITAL_COMMUNITY): Payer: Self-pay | Admitting: Psychiatry

## 2019-08-02 ENCOUNTER — Other Ambulatory Visit: Payer: Self-pay

## 2019-08-02 ENCOUNTER — Other Ambulatory Visit (HOSPITAL_COMMUNITY): Payer: BC Managed Care – PPO | Attending: Psychiatry | Admitting: Psychiatry

## 2019-08-02 DIAGNOSIS — F4310 Post-traumatic stress disorder, unspecified: Secondary | ICD-10-CM | POA: Insufficient documentation

## 2019-08-02 DIAGNOSIS — F411 Generalized anxiety disorder: Secondary | ICD-10-CM | POA: Diagnosis not present

## 2019-08-02 DIAGNOSIS — F17210 Nicotine dependence, cigarettes, uncomplicated: Secondary | ICD-10-CM | POA: Insufficient documentation

## 2019-08-02 DIAGNOSIS — F329 Major depressive disorder, single episode, unspecified: Secondary | ICD-10-CM | POA: Insufficient documentation

## 2019-08-02 DIAGNOSIS — Z915 Personal history of self-harm: Secondary | ICD-10-CM | POA: Insufficient documentation

## 2019-08-02 DIAGNOSIS — F410 Panic disorder [episodic paroxysmal anxiety] without agoraphobia: Secondary | ICD-10-CM | POA: Insufficient documentation

## 2019-08-02 DIAGNOSIS — R4587 Impulsiveness: Secondary | ICD-10-CM | POA: Insufficient documentation

## 2019-08-02 DIAGNOSIS — N941 Unspecified dyspareunia: Secondary | ICD-10-CM | POA: Insufficient documentation

## 2019-08-02 DIAGNOSIS — F314 Bipolar disorder, current episode depressed, severe, without psychotic features: Secondary | ICD-10-CM | POA: Diagnosis not present
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
## 2019-08-06 ENCOUNTER — Other Ambulatory Visit: Payer: Self-pay

## 2019-08-06 ENCOUNTER — Telehealth (HOSPITAL_COMMUNITY): Payer: Self-pay | Admitting: Psychiatry

## 2019-08-06 ENCOUNTER — Encounter (HOSPITAL_COMMUNITY): Payer: Self-pay | Admitting: Psychiatry

## 2019-08-06 ENCOUNTER — Other Ambulatory Visit (HOSPITAL_COMMUNITY): Payer: BC Managed Care – PPO | Admitting: Psychiatry

## 2019-08-06 DIAGNOSIS — F410 Panic disorder [episodic paroxysmal anxiety] without agoraphobia: Secondary | ICD-10-CM | POA: Diagnosis not present

## 2019-08-06 DIAGNOSIS — F4310 Post-traumatic stress disorder, unspecified: Secondary | ICD-10-CM

## 2019-08-06 DIAGNOSIS — Z915 Personal history of self-harm: Secondary | ICD-10-CM | POA: Diagnosis not present

## 2019-08-06 DIAGNOSIS — F329 Major depressive disorder, single episode, unspecified: Secondary | ICD-10-CM | POA: Diagnosis not present

## 2019-08-06 DIAGNOSIS — R4587 Impulsiveness: Secondary | ICD-10-CM | POA: Diagnosis not present

## 2019-08-06 DIAGNOSIS — N941 Unspecified dyspareunia: Secondary | ICD-10-CM

## 2019-08-06 DIAGNOSIS — F17210 Nicotine dependence, cigarettes, uncomplicated: Secondary | ICD-10-CM | POA: Diagnosis not present

## 2019-08-06 NOTE — Progress Notes (Signed)
Virtual Visit via Telephone Note  I connected with Gina Sparks on 08/06/19 at  9:00 AM EDT by telephone and verified that I am speaking with the correct person using two identifiers.   I discussed the limitations, risks, security and privacy concerns of performing an evaluation and management service by telephone and the availability of in person appointments. I also discussed with the patient that there may be a patient responsible charge related to this service. The patient expressed understanding and agreed to proceed.   I discussed the assessment and treatment plan with the patient. The patient was provided an opportunity to ask questions and all were answered. The patient agreed with the plan and demonstrated an understanding of the instructions.   The patient was advised to call back or seek an in-person evaluation if the symptoms worsen or if the condition fails to improve as anticipated.  I provided 30 minutes of non-face-to-face time during this encounter.   Gina Rack, NP    Psychiatric Initial Adult Assessment   Patient Identification: Gina Sparks MRN:  [id number] Date of Evaluation:  08/06/2019 Referral Source: Therapist Ronalee Belts Chief Complaint:   Chief Complaint    Anxiety; Depression; Manic Behavior; Panic Attack; Stress     Visit Diagnosis:    ICD-10-CM   1. Dyspareunia in female  N94.10   2. Posttraumatic stress disorder  F43.10     History of Present Illness: Gina Sparks 23 year old Caucasian female presents after referral from therapist.  As she reports worsening depression anxiety and mood irritability.  She reports her psychiatrist recently adjusted her medications which is causing her mood to be all over the place.  She reports multiple stressors mainly related to her fianc.  Reports she was recently diagnosed with bipolar disorder.  States she was having a difficult time understanding her mood and feelings.  States her fianc was stating female  with similar disorder and feels that this has been a strain within the relationship.  Gina Sparks reported 2 previous inpatient admissions at age 9 and 40 for "suicidal thoughts and really bad depression" reports a history of cutting.  Currently denying suicidal or homicidal ideations.  Denies auditory or visual hallucinations.  Patient reports family history of mental illness.  Reports her father was diagnosed with "probable bipolar" as she reports she and her father was taking Zoloft at the same time.  Reported she spoke to her mother who is un education as this pertains to mental illness.   Associated Signs/Symptoms: Depression Symptoms:  depressed mood, feelings of worthlessness/guilt, difficulty concentrating, anxiety, (Hypo) Manic Symptoms:  Distractibility, Impulsivity, Irritable Mood, Anxiety Symptoms:  Excessive Worry, Social Anxiety, Psychotic Symptoms:  Hallucinations: None PTSD Symptoms: Avoidance:  Decreased Interest/Participation  Past Psychiatric History: [facility] at the age of  22 and 23 years old.   Previous Psychotropic Medications: No   Substance Abuse History in the last 12 months:  No.  Consequences of Substance Abuse: NA  Past Medical History:  Past Medical History:  Diagnosis Date  . ADHD   . Anxiety and depression    History reviewed. No pertinent surgical history.  Family Psychiatric History:   Family History:  Family History  Problem Relation Age of Onset  . Anxiety disorder Father   . Depression Father   . Anxiety disorder Maternal Grandmother   . Depression Maternal Grandmother   . Dementia Maternal Grandfather   . Breast cancer Paternal Grandmother 52  . Dementia Paternal Grandfather     Social  History:   Social History   Socioeconomic History  . Marital status: Single    Spouse name: Not on file  . Number of children: Not on file  . Years of education: Not on file  . Highest education level: Not on file  Occupational  History  . Not on file  Social Needs  . Financial resource strain: Not on file  . Food insecurity    Worry: Not on file    Inability: Not on file  . Transportation needs    Medical: Not on file    Non-medical: Not on file  Tobacco Use  . Smoking status: Current Every Day Smoker    Types: E-cigarettes  . Smokeless tobacco: Never Used  Substance and Sexual Activity  . Alcohol use: Not Currently    Comment: CC:  CCA  . Drug use: Yes    Types: Marijuana    Comment: CC: CCA (hx of drugs)  . Sexual activity: Yes    Birth control/protection: Implant  Lifestyle  . Physical activity    Days per week: 5 days    Minutes per session: Not on file  . Stress: Not on file  Relationships  . Social Musicianconnections    Talks on phone: Not on file    Gets together: Not on file    Attends religious service: Not on file    Active member of club or organization: Not on file    Attends meetings of clubs or organizations: Not on file    Relationship status: Not on file  Other Topics Concern  . Not on file  Social History Narrative  . Not on file    Additional Social History:   Allergies:   Allergies  Allergen Reactions  . Oxycodone-Acetaminophen Nausea And Vomiting and Nausea Only    Metabolic Disorder Labs: No results found for: HGBA1C, MPG No results found for: PROLACTIN No results found for: CHOL, TRIG, HDL, CHOLHDL, VLDL, LDLCALC Lab Results  Component Value Date   TSH 4.94 (H) 07/03/2012    Therapeutic Level Labs: No results found for: LITHIUM No results found for: CBMZ No results found for: VALPROATE  Current Medications: Current Outpatient Medications  Medication Sig Dispense Refill  . ibuprofen (ADVIL,MOTRIN) 600 MG tablet Take 1 tablet (600 mg total) by mouth every 6 (six) hours as needed. (Patient not taking: Reported on 06/18/2018) 30 tablet 0   No current facility-administered medications for this visit.     Musculoskeletal:   Psychiatric Specialty Exam: ROS   There were no vitals taken for this visit.There is no height or weight on file to calculate BMI.  General Appearance: NA  Eye Contact:  NA  Speech:  Clear and Coherent  Volume:  Normal  Mood:  Anxious and Depressed  Affect:  Congruent  Thought Process:  Coherent  Orientation:  Full (Time, Place, and Person)  Thought Content:  Logical  Suicidal Thoughts:  No  Homicidal Thoughts:  No  Memory:  Immediate;   Fair Recent;   Fair  Judgement:  Fair  Insight:  Fair  Psychomotor Activity:  Normal  Concentration:  Concentration: Fair  Recall:  [employer]Fair  Fund of Knowledge:Fair  Language: Fair  Akathisia:  No  Handed:  Right  AIMS (if indicated):   Assets:  Communication Skills Desire for Improvement Resilience Social Support  ADL's:  Intact  Cognition: WNL  Sleep:  Fair   Screenings:   Assessment and Plan: Admitted to Partial Hospitalization Continue medications as directed   Treatment plan  was reviewed and agreed upon by NP.Myrle Sheng and patient Gina Sparks need for group services    Derrill Center, NP 10/12/202010:36 AM

## 2019-08-06 NOTE — Progress Notes (Signed)
Virtual Visit via Video Note  I connected with Gina Sparks on 08/06/19 at  9:00 AM EDT by a video enabled telemedicine application and verified that I am speaking with the correct person using two identifiers.  Location: Patient: Gina Sparks Provider: Lise Auer, LCSW   I discussed the limitations of evaluation and management by telemedicine and the availability of in person appointments. The patient expressed understanding and agreed to proceed.  History of Present Illness: Bipolar 2 DO and PTSD   Observations/Objective: Case Manager checked in with all participants to review discharge dates, insurance authorizations, work-related documents and needs for the treatment team. Counselor processed current mood and functioning and discussed how participants spent their time since last session and if skills were applied. Today is Gina Sparks's first day in group, presenting with Bipolar 2 Disorder and PTSD. She shared about her need for treatment, her goals in treatment and about her current life stressors. She is facing personal, familial and stage of life issues, in addition to managing mental health.  Counselor provided psychoeducation on cognitive distortions through a video and 2 handouts. Group members identified which distortions happen most in their minds. Gina Sparks identified that she engages in black and white thinking, filtering, and unrealistic expectations of self. She would like to share this information with her fiance, to better help him understand her automatic thought processes. Counselor shared strategies and skills in combating cognitive distortions, through a video, a worksheet and an article. Group members shared their responses from the worksheet called "Changing Automatic Thoughts." Gina Sparks chose to process her common thoughts related to forgetfulness and self-blame. Group members gave feedback on which skills and strategies they would apply to address negative cognitions, rumination,  distorted thinking and anxious thought cycles. Gina Sparks plans to try auditory and verbal interruptions, muscle isolation and scattered counting. Counselor checked in with all group members to share their plans for the afternoon and their overall mood. Gina Sparks stated that she had many phone calls to service providers that she needed to make related to her recent move. She presented with moderate depression and moderate anxiety during today's session.    Assessment and Plan: Counselor recommends that patient remains in IOP treatment to better manage mental health symptoms and continue to address treatment plan goals. Counselor recommends adherence to crisis/safety plan, taking medications as prescribed and following up with medical professionals if any issues arise.   Follow Up Instructions: Counselor will send Webex link for next session.    I discussed the assessment and treatment plan with the patient. The patient was provided an opportunity to ask questions and all were answered. The patient agreed with the plan and demonstrated an understanding of the instructions.   The patient was advised to call back or seek an in-person evaluation if the symptoms worsen or if the condition fails to improve as anticipated.  I provided 180 minutes of non-face-to-face time during this encounter.   Lise Auer, LCSW

## 2019-08-07 ENCOUNTER — Other Ambulatory Visit (HOSPITAL_COMMUNITY): Payer: BC Managed Care – PPO | Admitting: Psychiatry

## 2019-08-07 ENCOUNTER — Encounter (HOSPITAL_COMMUNITY): Payer: Self-pay

## 2019-08-07 DIAGNOSIS — F4310 Post-traumatic stress disorder, unspecified: Secondary | ICD-10-CM | POA: Diagnosis not present

## 2019-08-07 DIAGNOSIS — F17210 Nicotine dependence, cigarettes, uncomplicated: Secondary | ICD-10-CM | POA: Diagnosis not present

## 2019-08-07 DIAGNOSIS — R4587 Impulsiveness: Secondary | ICD-10-CM | POA: Diagnosis not present

## 2019-08-07 DIAGNOSIS — N941 Unspecified dyspareunia: Secondary | ICD-10-CM | POA: Diagnosis not present

## 2019-08-07 DIAGNOSIS — F410 Panic disorder [episodic paroxysmal anxiety] without agoraphobia: Secondary | ICD-10-CM | POA: Diagnosis not present

## 2019-08-07 DIAGNOSIS — F3181 Bipolar II disorder: Secondary | ICD-10-CM

## 2019-08-07 DIAGNOSIS — Z915 Personal history of self-harm: Secondary | ICD-10-CM | POA: Diagnosis not present

## 2019-08-07 DIAGNOSIS — F329 Major depressive disorder, single episode, unspecified: Secondary | ICD-10-CM | POA: Diagnosis not present

## 2019-08-07 NOTE — Progress Notes (Signed)
Virtual Visit via Video Note  I connected with Gina Sparks on 08/07/19 at  9:00 AM EDT by a video enabled telemedicine application and verified that I am speaking with the correct person using two identifiers.  Location: Patient: Gina Sparks Provider: Lise Auer, LCSW   I discussed the limitations of evaluation and management by telemedicine and the availability of in person appointments. The patient expressed understanding and agreed to proceed.  History of Present Illness: Bipolar 2 DO and PTSD   Observations/Objective: Case Manager checked in with all participants to review discharge dates, insurance authorizations, work-related documents and needs for the treatment team. Counselor processed current mood and functioning and discussed how participants spent their time since last session and if skills were applied. Gina Sparks shared that she was able to get most of her calls completed as hoped yesterday. She joined group more down and depressed than yesterday. Counselor engaged the group in an activity called, "Cognitive Distortions of Self." Group members shared feedback on their written reflections. Gina Sparks shared that she is mean to herself, finds it difficult to find any positives or strengths in herself. She stated that this is something she has been working on in individual therapy. Counselor was able to help her to identify some strengths. Counselor introduced the group to the concept of Right Brain vs Left Brain Functions. Counselor shared an article and video explaining the role of emotions and logic, as well as strategies in balancing these functions for sound decision making. Group members shared their thoughts on this topic and how it plays out in their thoughts and feelings. Gina Sparks was able to engage in the activities and discussion. She shared that she had a revelation about how her brain processes her risk taking and past additions/addictive behaviors. Counselor introduced the  concept of "Locus of Control" and shared a video explaining the activity they completed in assessing their sphere of influence and control in current stressful situations. Group members shared their reflections. Gina Sparks worked out a current issues related to her relationship with her mother. She had thoughtful insights and processing. Counselor checked in with all group members to share their plans for the afternoon and their overall mood. Gina Sparks shared that she plans to attend work today and shared about her job with the group. Gina Sparks presented with moderate depression and anxiety.    Assessment and Plan: Counselor recommends that patient remains in IOP treatment to better manage mental health symptoms and continue to address treatment plan goals. Counselor recommends adherence to crisis/safety plan, taking medications as prescribed and following up with medical professionals if any issues arise.   Follow Up Instructions: Counselor will send Webex link for next session.    I discussed the assessment and treatment plan with the patient. The patient was provided an opportunity to ask questions and all were answered. The patient agreed with the plan and demonstrated an understanding of the instructions.   The patient was advised to call back or seek an in-person evaluation if the symptoms worsen or if the condition fails to improve as anticipated.  I provided 180 minutes of non-face-to-face time during this encounter.   Lise Auer, LCSW

## 2019-08-08 ENCOUNTER — Telehealth (HOSPITAL_COMMUNITY): Payer: Self-pay | Admitting: Psychiatry

## 2019-08-08 ENCOUNTER — Other Ambulatory Visit: Payer: Self-pay

## 2019-08-08 ENCOUNTER — Other Ambulatory Visit (HOSPITAL_COMMUNITY): Payer: BC Managed Care – PPO | Admitting: Psychiatry

## 2019-08-08 DIAGNOSIS — F314 Bipolar disorder, current episode depressed, severe, without psychotic features: Secondary | ICD-10-CM | POA: Diagnosis not present

## 2019-08-09 ENCOUNTER — Ambulatory Visit (HOSPITAL_COMMUNITY): Payer: BC Managed Care – PPO

## 2019-08-10 ENCOUNTER — Other Ambulatory Visit: Payer: Self-pay

## 2019-08-10 ENCOUNTER — Encounter (HOSPITAL_COMMUNITY): Payer: Self-pay

## 2019-08-10 ENCOUNTER — Other Ambulatory Visit (HOSPITAL_COMMUNITY): Payer: BC Managed Care – PPO | Admitting: Psychiatry

## 2019-08-10 DIAGNOSIS — Z915 Personal history of self-harm: Secondary | ICD-10-CM | POA: Diagnosis not present

## 2019-08-10 DIAGNOSIS — F329 Major depressive disorder, single episode, unspecified: Secondary | ICD-10-CM | POA: Diagnosis not present

## 2019-08-10 DIAGNOSIS — F410 Panic disorder [episodic paroxysmal anxiety] without agoraphobia: Secondary | ICD-10-CM | POA: Diagnosis not present

## 2019-08-10 DIAGNOSIS — F3181 Bipolar II disorder: Secondary | ICD-10-CM

## 2019-08-10 DIAGNOSIS — R4587 Impulsiveness: Secondary | ICD-10-CM | POA: Diagnosis not present

## 2019-08-10 DIAGNOSIS — F4310 Post-traumatic stress disorder, unspecified: Secondary | ICD-10-CM | POA: Diagnosis not present

## 2019-08-10 DIAGNOSIS — F17210 Nicotine dependence, cigarettes, uncomplicated: Secondary | ICD-10-CM | POA: Diagnosis not present

## 2019-08-10 DIAGNOSIS — N941 Unspecified dyspareunia: Secondary | ICD-10-CM | POA: Diagnosis not present

## 2019-08-10 NOTE — Progress Notes (Signed)
Virtual Visit via Video Note  I connected with CHELBIE JARNAGIN on 08/10/19 at  9:00 AM EDT by a video enabled telemedicine application and verified that I am speaking with the correct person using two identifiers.  Location: Patient: [name] Provider: Lise Auer, LCSW   I discussed the limitations of evaluation and management by telemedicine and the availability of in person appointments. The patient expressed understanding and agreed to proceed.  History of Present Illness: Bipolar 2 DO   Observations/Objective: Case Manager checked in with all participants to review discharge dates, insurance authorizations, work-related documents and needs for the treatment team. Counselor processed current mood and functioning and discussed how participants spent their time since last session and if skills were applied. Joanell shared about her challenges with her internet yesterday. She was very frustrated and overwhelmed with life stressors. Laconya shared about the emotional exhaustion she felt by unpacking her belongings, stating this was her 4th move in the past 3 weeks. She discussed the feelings of misunderstanding, lack of security and stability.She also reflected on her final therapy appointment on Wednesday and her thoughts about starting with a new therapist. Tiera presented with severe depression and moderate anxiety. Counselor prompted the group to journal on the topic of forgiveness. Group members shared their reflections. Chryl tearfully shared about her challenges with forgiveness in relation to her family members, partner and self. She shared about a successful forgiveness situation with her father. Counselor shared information from an article called, "Myths and Stages of Forgiveness." Counselor engaged them in determining how the myths relate to their beliefs and where they find themselves in the stages. Group members had open conversation and dialogue on their thoughts, feelings and  experiences with the topic. Aleese continued to present as overwhelmed throughout the session. She shared that the topic of forgiveness was bringing out triggers and reminders from her past. Counselor discussed coping strategies to regain regulation and the ability to process this more in future sessions with her individual therapist. Counselor discussed the creation of a safety/crisis plan for her due to comments she made about her high-risk category for SI and self-harm. Counselor checked in with all group members to share their plans for the afternoon and weekend and encouraged application of coping skills and self-care. Natausha plans to wash and style her hair today for self-care. She would like to have a low-key weekend to get settled into her home.   Assessment and Plan: Counselor recommends that patient remains in IOP treatment to better manage mental health symptoms and continue to address treatment plan goals. Counselor recommends adherence to crisis/safety plan, taking medications as prescribed and following up with medical professionals if any issues arise.   Follow Up Instructions: Counselor will send Webex link for next session.    I discussed the assessment and treatment plan with the patient. The patient was provided an opportunity to ask questions and all were answered. The patient agreed with the plan and demonstrated an understanding of the instructions.   The patient was advised to call back or seek an in-person evaluation if the symptoms worsen or if the condition fails to improve as anticipated.  I provided 180 minutes of non-face-to-face time during this encounter.   Lise Auer, LCSW

## 2019-08-13 ENCOUNTER — Other Ambulatory Visit (HOSPITAL_COMMUNITY): Payer: BC Managed Care – PPO | Admitting: Psychiatry

## 2019-08-13 ENCOUNTER — Encounter (HOSPITAL_COMMUNITY): Payer: Self-pay

## 2019-08-13 ENCOUNTER — Other Ambulatory Visit: Payer: Self-pay

## 2019-08-13 DIAGNOSIS — F4310 Post-traumatic stress disorder, unspecified: Secondary | ICD-10-CM

## 2019-08-13 DIAGNOSIS — F329 Major depressive disorder, single episode, unspecified: Secondary | ICD-10-CM | POA: Diagnosis not present

## 2019-08-13 DIAGNOSIS — F17210 Nicotine dependence, cigarettes, uncomplicated: Secondary | ICD-10-CM | POA: Diagnosis not present

## 2019-08-13 DIAGNOSIS — R4587 Impulsiveness: Secondary | ICD-10-CM | POA: Diagnosis not present

## 2019-08-13 DIAGNOSIS — Z915 Personal history of self-harm: Secondary | ICD-10-CM | POA: Diagnosis not present

## 2019-08-13 DIAGNOSIS — N941 Unspecified dyspareunia: Secondary | ICD-10-CM | POA: Diagnosis not present

## 2019-08-13 DIAGNOSIS — F410 Panic disorder [episodic paroxysmal anxiety] without agoraphobia: Secondary | ICD-10-CM | POA: Diagnosis not present

## 2019-08-13 DIAGNOSIS — F3181 Bipolar II disorder: Secondary | ICD-10-CM
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
## 2019-08-14 ENCOUNTER — Other Ambulatory Visit: Payer: Self-pay

## 2019-08-14 ENCOUNTER — Other Ambulatory Visit (HOSPITAL_COMMUNITY): Payer: BC Managed Care – PPO | Admitting: Psychiatry

## 2019-08-14 ENCOUNTER — Encounter (HOSPITAL_COMMUNITY): Payer: Self-pay

## 2019-08-14 DIAGNOSIS — F410 Panic disorder [episodic paroxysmal anxiety] without agoraphobia: Secondary | ICD-10-CM | POA: Diagnosis not present

## 2019-08-14 DIAGNOSIS — F329 Major depressive disorder, single episode, unspecified: Secondary | ICD-10-CM | POA: Diagnosis not present

## 2019-08-14 DIAGNOSIS — F4310 Post-traumatic stress disorder, unspecified: Secondary | ICD-10-CM | POA: Diagnosis not present

## 2019-08-14 DIAGNOSIS — R4587 Impulsiveness: Secondary | ICD-10-CM | POA: Diagnosis not present

## 2019-08-14 DIAGNOSIS — F3181 Bipolar II disorder: Secondary | ICD-10-CM

## 2019-08-14 DIAGNOSIS — N941 Unspecified dyspareunia: Secondary | ICD-10-CM | POA: Diagnosis not present

## 2019-08-14 DIAGNOSIS — F17210 Nicotine dependence, cigarettes, uncomplicated: Secondary | ICD-10-CM | POA: Diagnosis not present

## 2019-08-14 DIAGNOSIS — Z915 Personal history of self-harm: Secondary | ICD-10-CM | POA: Diagnosis not present

## 2019-08-14 NOTE — Progress Notes (Signed)
Virtual Visit via Video Note  I connected with Gina Sparks on 08/14/19 at  9:00 AM EDT by a video enabled telemedicine application and verified that I am speaking with the correct person using two identifiers.  Location: Patient: Gina Sparks Provider: Lise Auer, LCSW   I discussed the limitations of evaluation and management by telemedicine and the availability of in person appointments. The patient expressed understanding and agreed to proceed.  History of Present Illness: Bipolar 2 DO   Observations/Objective: Case Manager checked in with all participants to review discharge dates, insurance authorizations, work-related documents and needs for the treatment team. Counselor processed current mood and functioning and discussed how participants spent their time since last session and if skills were applied. Laurisa shared that she is starting to see progress in her ability to communicate more effectively with her SO. She was able to connect with family yesterday and that was fulfilling. Johnika has concerns about her medications and her weight gain over the past few months. Counselor encouraged her to communicate this concern with her providers. Tavonna presented with moderate depression and moderate anxiety.  Counselor reviewed the handout on the creation of S.M.A.R.T goals and prompted group members to share their goals with the group. Some group needed feedback and ideas from the group for clear development and application, so the group discussed openly. Chiquita has a goal of stabilizing her mental health and gaining more control of her reactions. She sought assistance from the group in making it a more concrete goal with baby steps. Counselor shared psychoeducation on mental health diagnoses and the process of assessment, diagnosis and treatment. Group members discussed their experiences with better understanding and treating their mental health. The group discussed barriers to care, accurate  diagnosis, stigma and sharing with others.  Phelicia discussed her progression in being diagnosed and her families reaction to her having a long term mental illness. Counselor spent the remainder of group celebrating the progress from a graduating group member. He shared his process ad takeaways as well as encouragement for those remaining in group. Group members all shared well wishes for him as he steps down.    Assessment and Plan: Counselor recommends that patient remains in IOP treatment to better manage mental health symptoms and continue to address treatment plan goals. Counselor recommends adherence to crisis/safety plan, taking medications as prescribed and following up with medical professionals if any issues arise.   Follow Up Instructions: Counselor will send Webex link for next session.    I discussed the assessment and treatment plan with the patient. The patient was provided an opportunity to ask questions and all were answered. The patient agreed with the plan and demonstrated an understanding of the instructions.   The patient was advised to call back or seek an in-person evaluation if the symptoms worsen or if the condition fails to improve as anticipated.  I provided 180 minutes of non-face-to-face time during this encounter.   Lise Auer, LCSW

## 2019-08-15 ENCOUNTER — Other Ambulatory Visit (HOSPITAL_COMMUNITY): Payer: BC Managed Care – PPO | Admitting: Psychiatry

## 2019-08-15 ENCOUNTER — Encounter (HOSPITAL_COMMUNITY): Payer: Self-pay

## 2019-08-15 ENCOUNTER — Other Ambulatory Visit: Payer: Self-pay

## 2019-08-15 DIAGNOSIS — F3181 Bipolar II disorder: Secondary | ICD-10-CM

## 2019-08-15 DIAGNOSIS — F410 Panic disorder [episodic paroxysmal anxiety] without agoraphobia: Secondary | ICD-10-CM | POA: Diagnosis not present

## 2019-08-15 DIAGNOSIS — F4310 Post-traumatic stress disorder, unspecified: Secondary | ICD-10-CM | POA: Diagnosis not present

## 2019-08-15 DIAGNOSIS — F329 Major depressive disorder, single episode, unspecified: Secondary | ICD-10-CM | POA: Diagnosis not present

## 2019-08-15 DIAGNOSIS — R4587 Impulsiveness: Secondary | ICD-10-CM | POA: Diagnosis not present

## 2019-08-15 DIAGNOSIS — N941 Unspecified dyspareunia: Secondary | ICD-10-CM | POA: Diagnosis not present

## 2019-08-15 DIAGNOSIS — F17210 Nicotine dependence, cigarettes, uncomplicated: Secondary | ICD-10-CM | POA: Diagnosis not present

## 2019-08-15 DIAGNOSIS — Z915 Personal history of self-harm: Secondary | ICD-10-CM | POA: Diagnosis not present
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
## 2019-08-16 ENCOUNTER — Encounter (HOSPITAL_COMMUNITY): Payer: Self-pay

## 2019-08-16 ENCOUNTER — Other Ambulatory Visit: Payer: Self-pay

## 2019-08-16 ENCOUNTER — Other Ambulatory Visit (HOSPITAL_COMMUNITY): Payer: BC Managed Care – PPO | Admitting: Psychiatry

## 2019-08-16 DIAGNOSIS — N941 Unspecified dyspareunia: Secondary | ICD-10-CM | POA: Diagnosis not present

## 2019-08-16 DIAGNOSIS — R4587 Impulsiveness: Secondary | ICD-10-CM | POA: Diagnosis not present

## 2019-08-16 DIAGNOSIS — F329 Major depressive disorder, single episode, unspecified: Secondary | ICD-10-CM | POA: Diagnosis not present

## 2019-08-16 DIAGNOSIS — F4310 Post-traumatic stress disorder, unspecified: Secondary | ICD-10-CM | POA: Diagnosis not present

## 2019-08-16 DIAGNOSIS — F410 Panic disorder [episodic paroxysmal anxiety] without agoraphobia: Secondary | ICD-10-CM | POA: Diagnosis not present

## 2019-08-16 DIAGNOSIS — F3181 Bipolar II disorder: Secondary | ICD-10-CM

## 2019-08-16 DIAGNOSIS — F17210 Nicotine dependence, cigarettes, uncomplicated: Secondary | ICD-10-CM | POA: Diagnosis not present

## 2019-08-16 DIAGNOSIS — Z915 Personal history of self-harm: Secondary | ICD-10-CM | POA: Diagnosis not present

## 2019-08-16 NOTE — Progress Notes (Signed)
Virtual Visit via Video Note  I connected with GWENDOLIN BRIEL on 08/16/19 at  9:00 AM EDT by a video enabled telemedicine application and verified that I am speaking with the correct person using two identifiers.  Location: Patient: Gina Sparks Provider: Lise Auer, LCSW   I discussed the limitations of evaluation and management by telemedicine and the availability of in person appointments. The patient expressed understanding and agreed to proceed.  History of Present Illness: Bipolar 2 DO and PTSD  Observations/Objective: Case Manager checked in with all participants to review discharge dates, insurance authorizations, work-related documents and needs for the treatment team. Counselor introduced guest speaker, Jeanella Craze, [place], to facilitate a discussion around Grief and Loss topics. Jaria shared about a recent change in friendship status and grieving the relationship she was never able to have with her parents due to mental illness. Counselor allowed time for reflection and journaling and shared an article on grief and loss for their independent review. Jakirah identified that she is also grieving her being diagnosed with a mental illness and the losses that come with that. Counselor facilitated a brief check in with group members to gauge mood and current functioning. Cyrena presented with moderate to severe depression and moderate anxiety. Auriel shared that she was very tired from working late last night and from news about her best friend getting engaged.    Counselor engaged the group in an open discussion about Mental Health Resources they use or are aware of. Each group member shared, then the Counselor shared an exhaustive list of local, state, and national resources, as well as, aps and books that have been recommended. Counselor invited a Agricultural consultant from [place] to present on [facility] groups, peer support, and wellness academy.  Counselor prompted group members to identify which resources they are likely to look into and utilize to better maintain mental health. Elysia would like to get connected with the Trauma resources shared. Counselor closed session by group members sharing their plans for the afternoon. Tiana plans to work this afternoon.    Assessment and Plan: Counselor recommends that patient remains in IOP treatment to better manage mental health symptoms and continue to address treatment plan goals. Counselor recommends adherence to crisis/safety plan, taking medications as prescribed and following up with medical professionals if any issues arise.   Follow Up Instructions: Counselor will send Webex link for next session.    I discussed the assessment and treatment plan with the patient. The patient was provided an opportunity to ask questions and all were answered. The patient agreed with the plan and demonstrated an understanding of the instructions.   The patient was advised to call back or seek an in-person evaluation if the symptoms worsen or if the condition fails to improve as anticipated.  I provided 180 minutes of non-face-to-face time during this encounter.   Lise Auer, LCSW

## 2019-08-17 ENCOUNTER — Other Ambulatory Visit: Payer: Self-pay

## 2019-08-17 ENCOUNTER — Other Ambulatory Visit (HOSPITAL_COMMUNITY): Payer: BC Managed Care – PPO | Admitting: Psychiatry

## 2019-08-17 ENCOUNTER — Encounter (HOSPITAL_COMMUNITY): Payer: Self-pay

## 2019-08-17 DIAGNOSIS — F3181 Bipolar II disorder: Secondary | ICD-10-CM

## 2019-08-17 DIAGNOSIS — F4310 Post-traumatic stress disorder, unspecified: Secondary | ICD-10-CM

## 2019-08-17 DIAGNOSIS — F17210 Nicotine dependence, cigarettes, uncomplicated: Secondary | ICD-10-CM | POA: Diagnosis not present

## 2019-08-17 DIAGNOSIS — F410 Panic disorder [episodic paroxysmal anxiety] without agoraphobia: Secondary | ICD-10-CM | POA: Diagnosis not present

## 2019-08-17 DIAGNOSIS — Z915 Personal history of self-harm: Secondary | ICD-10-CM | POA: Diagnosis not present

## 2019-08-17 DIAGNOSIS — R4587 Impulsiveness: Secondary | ICD-10-CM | POA: Diagnosis not present

## 2019-08-17 DIAGNOSIS — N941 Unspecified dyspareunia: Secondary | ICD-10-CM | POA: Diagnosis not present

## 2019-08-17 DIAGNOSIS — F329 Major depressive disorder, single episode, unspecified: Secondary | ICD-10-CM | POA: Diagnosis not present

## 2019-08-17 NOTE — Progress Notes (Signed)
Virtual Visit via Video Note  I connected with ASHRITA CHRISMER on 08/17/19 at  9:00 AM EDT by a video enabled telemedicine application and verified that I am speaking with the correct person using two identifiers.  Location: Patient: Gina Sparks Provider: Lise Auer, LCSW   I discussed the limitations of evaluation and management by telemedicine and the availability of in person appointments. The patient expressed understanding and agreed to proceed.  History of Present Illness: Bipolar 2 DO and PTSD   Observations/Objective: Case Manager checked in with all participants to review discharge dates, insurance authorizations, work-related documents and needs for the treatment team. Counselor processed current mood and functioning and discussed how participants spent their time since last session and if skills were applied. Karess shared that she was tired today and experiencing nausea, which is causing a decreased appetite. She also reported feelings of depersonalization, feeling as though she is going into a depressive episode. We discussed contributing factors and preventative coping strategies. Beatric presented with moderate to sever depression and moderate anxiety today.    Counselor completed reviewing the remaining mental health resources with group members from our discussion yesterday. Counselor prompted group members to share about their experience with Panic Attacks. Haliey has GI issues and respiratory responses when experiencing a panic attack. Counselor introduced group to an article discussing the biological response during a panic attack as well as 5 coping strategies. Group members shared their thoughts, experiences and feedback. Laiken shared a variety of coping strategies she has used in the past and encouragement for others. Counselor shared an additional handout with the group called, "TIPP Skills", which discusses 4 coping skills to use during a panic attack. Group members shared  strategies they would likely use during their next panic attack. Mirela is interested in trying the "change the temperature" method.  Counselor presented on 8 breathing techniques to use during a panic attack, to reduce anxiety and for relaxation. Group members practiced the techniques and discussed which ones they most prefer. Jiovanna prefers the 4-7-8 method and enjoyed trying out the other methods. Counselor wrapped up group by having each group member share plans for the weekend, plan for self-care or coping skill application. Maylynn plans to work this weekend and have intentional rest time on Sunday.   Assessment and Plan: Counselor recommends that patient remains in IOP treatment to better manage mental health symptoms and continue to address treatment plan goals. Counselor recommends adherence to crisis/safety plan, taking medications as prescribed and following up with medical professionals if any issues arise.   Follow Up Instructions: Counselor will send Webex link for next session.    I discussed the assessment and treatment plan with the patient. The patient was provided an opportunity to ask questions and all were answered. The patient agreed with the plan and demonstrated an understanding of the instructions.   The patient was advised to call back or seek an in-person evaluation if the symptoms worsen or if the condition fails to improve as anticipated.  I provided 180 minutes of non-face-to-face time during this encounter.   Lise Auer, LCSW

## 2019-08-20 ENCOUNTER — Other Ambulatory Visit: Payer: Self-pay

## 2019-08-20 ENCOUNTER — Other Ambulatory Visit (HOSPITAL_COMMUNITY): Payer: BC Managed Care – PPO | Admitting: Psychiatry

## 2019-08-20 DIAGNOSIS — R4587 Impulsiveness: Secondary | ICD-10-CM | POA: Diagnosis not present

## 2019-08-20 DIAGNOSIS — Z915 Personal history of self-harm: Secondary | ICD-10-CM | POA: Diagnosis not present

## 2019-08-20 DIAGNOSIS — F4310 Post-traumatic stress disorder, unspecified: Secondary | ICD-10-CM

## 2019-08-20 DIAGNOSIS — N941 Unspecified dyspareunia: Secondary | ICD-10-CM | POA: Diagnosis not present

## 2019-08-20 DIAGNOSIS — F17210 Nicotine dependence, cigarettes, uncomplicated: Secondary | ICD-10-CM | POA: Diagnosis not present

## 2019-08-20 DIAGNOSIS — F329 Major depressive disorder, single episode, unspecified: Secondary | ICD-10-CM | POA: Diagnosis not present

## 2019-08-20 DIAGNOSIS — F3181 Bipolar II disorder: Secondary | ICD-10-CM

## 2019-08-20 DIAGNOSIS — F410 Panic disorder [episodic paroxysmal anxiety] without agoraphobia: Secondary | ICD-10-CM | POA: Diagnosis not present

## 2019-08-20 NOTE — Progress Notes (Signed)
Virtual Visit via Video Note  I connected with CHLOIE LONEY on 08/20/19 at  9:00 AM EDT by a video enabled telemedicine application and verified that I am speaking with the correct person using two identifiers.  Location: Patient: Gina Sparks Provider: Lise Auer, LCSW   I discussed the limitations of evaluation and management by telemedicine and the availability of in person appointments. The patient expressed understanding and agreed to proceed.  History of Present Illness: Bipolar 2 DO and PTSD   Observations/Objective: Case Manager checked in with all participants to review discharge dates, insurance authorizations, work-related documents and needs for the treatment team. Counselor processed current mood and functioning and discussed how participants spent their time since last session and if skills were applied. Bayla shared that she worked over the weekend. She expressed feelings of exhaustion, stress, tension and a desire to smoke cigarettes, which she has given up for the past 9 months. Counselor and Group helped her explore strategies in dealing with the craving. Alyah shared that her trigger is that she will have to put her dog down this week due to ongoing medical issues and age. Jaylyn was appreciative of the support and feedback. Mahoganie presented with moderate depression and anxiety today.   Counselor presented information on Restaurant manager, fast food for [employer]. Group members asked questions, engaged in discussion, shared ideas and strategies and identified ways the could apply the skills to their lives. Uma shared that she would like to continue focusing on stabilizing her mental health, through assessing and dealing with traumas and triggers. She sees a need for more structure and organization in her life and will communicate with her partner more about that.   Counselor wrapped up the session by having each group member shared their plans for  the afternoon. Laniece plans to work this afternoon and hopes to spend quality time with her dog and partner.    Assessment and Plan: Counselor recommends that patient remains in IOP treatment to better manage mental health symptoms and continue to address treatment plan goals. Counselor recommends adherence to crisis/safety plan, taking medications as prescribed and following up with medical professionals if any issues arise.   Follow Up Instructions: Counselor will send Webex link for next session.    I discussed the assessment and treatment plan with the patient. The patient was provided an opportunity to ask questions and all were answered. The patient agreed with the plan and demonstrated an understanding of the instructions.   The patient was advised to call back or seek an in-person evaluation if the symptoms worsen or if the condition fails to improve as anticipated.  I provided 180 minutes of non-face-to-face time during this encounter.   Lise Auer, LCSW

## 2019-08-21 ENCOUNTER — Encounter (HOSPITAL_COMMUNITY): Payer: Self-pay | Admitting: Psychiatry

## 2019-08-21 ENCOUNTER — Encounter (HOSPITAL_COMMUNITY): Payer: Self-pay

## 2019-08-21 ENCOUNTER — Other Ambulatory Visit (HOSPITAL_COMMUNITY): Payer: BC Managed Care – PPO | Admitting: Psychiatry

## 2019-08-21 ENCOUNTER — Other Ambulatory Visit: Payer: Self-pay

## 2019-08-21 DIAGNOSIS — F410 Panic disorder [episodic paroxysmal anxiety] without agoraphobia: Secondary | ICD-10-CM | POA: Diagnosis not present

## 2019-08-21 DIAGNOSIS — R4587 Impulsiveness: Secondary | ICD-10-CM | POA: Diagnosis not present

## 2019-08-21 DIAGNOSIS — F4310 Post-traumatic stress disorder, unspecified: Secondary | ICD-10-CM | POA: Diagnosis not present

## 2019-08-21 DIAGNOSIS — F3181 Bipolar II disorder: Secondary | ICD-10-CM

## 2019-08-21 DIAGNOSIS — F17210 Nicotine dependence, cigarettes, uncomplicated: Secondary | ICD-10-CM | POA: Diagnosis not present

## 2019-08-21 DIAGNOSIS — N941 Unspecified dyspareunia: Secondary | ICD-10-CM | POA: Diagnosis not present

## 2019-08-21 DIAGNOSIS — Z915 Personal history of self-harm: Secondary | ICD-10-CM | POA: Diagnosis not present

## 2019-08-21 DIAGNOSIS — F329 Major depressive disorder, single episode, unspecified: Secondary | ICD-10-CM | POA: Diagnosis not present

## 2019-08-21 NOTE — Progress Notes (Signed)
Virtual Visit via Video Note  I connected with Gina Sparks on 08/21/19 at  9:00 AM EDT by a video enabled telemedicine application and verified that I am speaking with the correct person using two identifiers.  Location: Patient: Gina Sparks Provider: Lise Auer, LCSW   I discussed the limitations of evaluation and management by telemedicine and the availability of in person appointments. The patient expressed understanding and agreed to proceed.  History of Present Illness: Bipolar 1 DO   Observations/Objective: Case Manager checked in with all participants to review discharge dates, insurance authorizations, work-related documents and needs for the treatment team. Counselor processed current mood and functioning and discussed how participants spent their time since last session and if skills were applied.  Gina Sparks shared that she is tired today due to closing at her job last night. She anticipates Thursday being a difficult day, as her dog of 12 years will need to be put down. She shared about various coping strategies she can use to prepare for the loss and cope afterwards. She presented with moderate depression and anxiety.   Counselor continued presentation of information on Restaurant manager, fast food for Better Mental Health: Optimizing Your Mind, [employer] and [employer]. Group members asked questions, engaged in discussion, shared ideas and strategies and identified ways they could apply the skills to their lives. Gina Sparks was able to share a meaningful quote with the group that she uses as motivation. She wants to make more space in her schedule for individual therapy and getting connected with a support group. She created a new daily routine that includes her waking up earlier, eating, jogging, meditating, and being more prepared for work. .  Counselor wrapped up the session by having each group member shared their plans for the afternoon. She plan on checking the news and the mail,  getting coffee out, talking with her fiance and going to work today.    Assessment and Plan: Counselor recommends that patient remains in IOP treatment to better manage mental health symptoms and continue to address treatment plan goals. Counselor recommends adherence to crisis/safety plan, taking medications as prescribed and following up with medical professionals if any issues arise.   Follow Up Instructions: Counselor will send Webex link for next session.    I discussed the assessment and treatment plan with the patient. The patient was provided an opportunity to ask questions and all were answered. The patient agreed with the plan and demonstrated an understanding of the instructions.   The patient was advised to call back or seek an in-person evaluation if the symptoms worsen or if the condition fails to improve as anticipated.  I provided 180 minutes of non-face-to-face time during this encounter.   Lise Auer, LCSW

## 2019-08-22 ENCOUNTER — Encounter (HOSPITAL_COMMUNITY): Payer: Self-pay

## 2019-08-22 ENCOUNTER — Other Ambulatory Visit: Payer: Self-pay

## 2019-08-22 ENCOUNTER — Other Ambulatory Visit (HOSPITAL_COMMUNITY): Payer: BC Managed Care – PPO | Admitting: Psychiatry

## 2019-08-22 DIAGNOSIS — F329 Major depressive disorder, single episode, unspecified: Secondary | ICD-10-CM | POA: Diagnosis not present

## 2019-08-22 DIAGNOSIS — F17210 Nicotine dependence, cigarettes, uncomplicated: Secondary | ICD-10-CM | POA: Diagnosis not present

## 2019-08-22 DIAGNOSIS — N941 Unspecified dyspareunia: Secondary | ICD-10-CM | POA: Diagnosis not present

## 2019-08-22 DIAGNOSIS — F3181 Bipolar II disorder: Secondary | ICD-10-CM

## 2019-08-22 DIAGNOSIS — F4310 Post-traumatic stress disorder, unspecified: Secondary | ICD-10-CM

## 2019-08-22 DIAGNOSIS — F410 Panic disorder [episodic paroxysmal anxiety] without agoraphobia: Secondary | ICD-10-CM | POA: Diagnosis not present

## 2019-08-22 DIAGNOSIS — R4587 Impulsiveness: Secondary | ICD-10-CM | POA: Diagnosis not present

## 2019-08-22 DIAGNOSIS — Z915 Personal history of self-harm: Secondary | ICD-10-CM | POA: Diagnosis not present

## 2019-08-22 NOTE — Progress Notes (Signed)
Virtual Visit via Telephone Note  I connected with Gina Sparks on 08/22/19 at  9:00 AM EDT by telephone and verified that I am speaking with the correct person using two identifiers.   I discussed the limitations, risks, security and privacy concerns of performing an evaluation and management service by telephone and the availability of in person appointments. I also discussed with the patient that there may be a patient responsible charge related to this service. The patient expressed understanding and agreed to proceed.    I discussed the assessment and treatment plan with the patient. The patient was provided an opportunity to ask questions and all were answered. The patient agreed with the plan and demonstrated an understanding of the instructions.   The patient was advised to call back or seek an in-person evaluation if the symptoms worsen or if the condition fails to improve as anticipated.  I provided 15 minutes of non-face-to-face time during this encounter.   Derrill Center, NP   [facility] Discharge Summary  Gina Sparks [id number]  Admission date: 08/06/2019 Discharge date: 08/22/2019  Reason for admission: Per admission assessment note: Gina Sparks 23 year old Caucasian female presents after referral from therapist.  As she reports worsening depression anxiety and mood irritability.  She reports her psychiatrist recently adjusted her medications which is causing her mood to be all over the place.  She reports multiple stressors mainly related to her fianc.  Reports she was recently diagnosed with bipolar disorder.  States she was having a difficult time understanding her mood and feelings.  States her fianc was stating female with similar disorder and feels that this has been a strain within the relationship.  Chemical Use History: Denied  Family of Origin Issues: Reported she continues to work on relationship stressors  between she and her [place].  reported family pet related stressors however she has been dealing with them more effectively since attending the program.  Progress in Program Toward Treatment Goals: Ongoing, Dyst[place] attended and participated with active and engaged participation.  Currently denying suicidal or homicidal ideations.  Denies auditory or visual hallucinations.  Patient reports learning new coping skills, as she feels will best help her between she and her fianc.  Denied medication refills at this time.  She reports she has medications via mail in.  Progress (rationale):  Keep follow with Jacelyn Pi, LCSW  Take all medications as prescribed. Keep all follow-up appointments as scheduled.  Do not consume alcohol or use illegal drugs while on prescription medications. Report any adverse effects from your medications to your primary care provider promptly.  In the event of recurrent symptoms or worsening symptoms, call 911, a crisis hotline, or go to the nearest emergency department for evaluation.   Derrill Center, NP 08/22/2019

## 2019-08-22 NOTE — Progress Notes (Signed)
Virtual Visit via Video Note  I connected with Gina Sparks on 08/22/19 at  9:00 AM EDT by a video enabled telemedicine application and verified that I am speaking with the correct person using two identifiers.  Location: Patient: Gina Sparks Provider: Lise Auer, LCSW   I discussed the limitations of evaluation and management by telemedicine and the availability of in person appointments. The patient expressed understanding and agreed to proceed.  History of Present Illness: Bipolar 2 DO and PTSD   Observations/Objective: Case Manager checked in with all participants to review discharge dates, insurance authorizations, work-related documents and needs for the treatment team. Counselor processed current mood and functioning and discussed how participants spent their time since last session and if skills were applied. Gina Sparks shared that she has decided to leave the program tomorrow and shared her reasons why. She expressed gratitude for all she has learned and was saddened that her treatment was being cut short. She reported feeling very tired, upset about her ill dog, hormonal considerations and feels a depressive episode coming on. Despite the things of concern, Gina Sparks shared that she feels like she is "the best I've ever been mentally," and that she is proud of herself.   Counselor engaged group members in an ice breaker, since we have a new group member, to list, then share their favorite aspects of Fall. Group members shared their preferences and we connected it to grounding activities presented in the past. Counselor engaged the group in an activity to examine their internal and external emotional and cognitive experiences, through the Inside Out Mask intervention. Group members were given the instructions and allowed time to work on their visual expressions. We will complete activity tomorrow and process reflections.   Counselor introduced group members to [name] from [employer] at [employer], as she presented on the topic of Self-Care and [employer]. Group members engaged in discussion, asked questions and identified takeaways/personal goals.   Counselor wrapped up the session by having each group member share their plans for the afternoon. Gina Sparks plans to work and enjoyed the information provided by [name], planing to [employer].    Assessment and Plan: Counselor recommends that patient remains in IOP treatment to better manage mental health symptoms and continue to address treatment plan goals. Counselor recommends adherence to crisis/safety plan, taking medications as prescribed and following up with medical professionals if any issues arise.   Follow Up Instructions: Counselor will send Webex link for next session.    I discussed the assessment and treatment plan with the patient. The patient was provided an opportunity to ask questions and all were answered. The patient agreed with the plan and demonstrated an understanding of the instructions.   The patient was advised to call back or seek an in-person evaluation if the symptoms worsen or if the condition fails to improve as anticipated.  I provided 180 minutes of non-face-to-face time during this encounter.   Lise Auer, LCSW

## 2019-08-23 ENCOUNTER — Encounter (HOSPITAL_COMMUNITY): Payer: Self-pay | Admitting: Psychiatry

## 2019-08-23 ENCOUNTER — Other Ambulatory Visit (HOSPITAL_COMMUNITY): Payer: BC Managed Care – PPO | Admitting: Psychiatry

## 2019-08-23 ENCOUNTER — Other Ambulatory Visit: Payer: Self-pay

## 2019-08-23 DIAGNOSIS — Z915 Personal history of self-harm: Secondary | ICD-10-CM | POA: Diagnosis not present

## 2019-08-23 DIAGNOSIS — N941 Unspecified dyspareunia: Secondary | ICD-10-CM | POA: Diagnosis not present

## 2019-08-23 DIAGNOSIS — F4310 Post-traumatic stress disorder, unspecified: Secondary | ICD-10-CM | POA: Diagnosis not present

## 2019-08-23 DIAGNOSIS — R4587 Impulsiveness: Secondary | ICD-10-CM | POA: Diagnosis not present

## 2019-08-23 DIAGNOSIS — F17210 Nicotine dependence, cigarettes, uncomplicated: Secondary | ICD-10-CM | POA: Diagnosis not present

## 2019-08-23 DIAGNOSIS — F329 Major depressive disorder, single episode, unspecified: Secondary | ICD-10-CM | POA: Diagnosis not present

## 2019-08-23 DIAGNOSIS — F3181 Bipolar II disorder: Secondary | ICD-10-CM

## 2019-08-23 DIAGNOSIS — F410 Panic disorder [episodic paroxysmal anxiety] without agoraphobia: Secondary | ICD-10-CM | POA: Diagnosis not present

## 2019-08-23 NOTE — Progress Notes (Signed)
Virtual Visit via Video Note  I connected with THI KLICH on 08/23/19 at  9:00 AM EDT by a video enabled telemedicine application and verified that I am speaking with the correct person using two identifiers.  Location: Patient: Gina Sparks Provider: Lise Auer, LCSW   I discussed the limitations of evaluation and management by telemedicine and the availability of in person appointments. The patient expressed understanding and agreed to proceed.  History of Present Illness: Bipolar 2 DO and PTSD   Observations/Objective: Case Manager checked in with all participants to review discharge dates, insurance authorizations, work-related documents and needs for the treatment team. Counselor processed current mood and functioning and discussed how participants spent their time since last session and if skills were applied. Terresa shared about the complexities in reconnecting with her mother, about her work, having positive experiences with her fiance and starting to plan for her wedding. Jackelyne shared some about the progress she has made in group over the past few weeks, stating she has better awareness and understanding about her symptoms, episodes and warning signs and can now communicate those better with her fiance and dad. Arlett presented with moderate depression and mild anxiety.   Counselor engaged the group in taking time to finish their [place], then each group members shared about what they created and the meaning behind the visuals. Shyanne identified that on the outside she likes to wear lots of make up and paint her face to distract from what's going on internally. She drew a beautiful picture of a sunset with sharks circling in the water to describe the highs and lows of her Bipolar DO. She shared that being able to express herself through art is very helpful. Counselor allowed [place], since she is graduating from group, to choose a guided imagery, called Finding Your  Authentic Self, which included progressive muscle relaxation, deep breathing, visualizations and positive affirmations. Group members shared about their experiences. Emarie stated that during the practice, it was hard to think about her childhood and find a happy time, but some memories surfaced that were helpful. She reports continuing to struggle with identifying aspirations. She enjoyed the relaxation and ability to view herself in a more positive light.   Counselor spent the remainder of group celebrating the progress made by [place] during treatment. We reflected on how she entered the program and she shared her progress and takeaways as well as encouragement for those remaining in group. She identified that she is crying less, communicating more, feels more stable, is starting to love herself more, has become more organized and focuses on self-care. Group members all shared well wishes for her as she steps down.    Assessment and Plan: Counselor recommends that patient steps down to individual therapy and medication management to manage mental health symptoms and continue to address treatment plan goals. Counselor recommends adherence to crisis/safety plan, taking medications as prescribed and following up with medical professionals if any issues arise.   Follow Up Instructions: Counselor will send Webex link for next session.    I discussed the assessment and treatment plan with the patient. The patient was provided an opportunity to ask questions and all were answered. The patient agreed with the plan and demonstrated an understanding of the instructions.   The patient was advised to call back or seek an in-person evaluation if the symptoms worsen or if the condition fails to improve as anticipated.  I provided 180 minutes of non-face-to-face time during this encounter.  Lise Auer, LCSW

## 2019-08-23 NOTE — Patient Instructions (Signed)
D:  Patient successfully completed MH-IOP today.  A:  Follow up with Dr. Hermina Barters next week and Darius (therapist) on 08-28-19.  Encouraged support groups.  R:  Patient receptive.

## 2019-08-23 NOTE — Progress Notes (Signed)
Virtual Visit via Video Note  I connected with Gina Sparks on 08/23/19 at 1300 by a video enabled telemedicine application and verified that I am speaking with the correct person using two identifiers.  I discussed the limitations of evaluation and management by telemedicine and the availability of in person appointments. The patient expressed understanding and agreed to proceed. I discussed the assessment and treatment plan with the patient. The patient was provided an opportunity to ask questions and all were answered. The patient agreed with the plan and demonstrated an understanding of the instructions.  The patient was advised to call back or seek an in-person evaluation if the symptoms worsen or if the condition fails to improve as anticipated.  I provided 20 minutes of non-face-to-face time during this encounter.  As per previous CCA: This is a 23 yr old, employed, engaged, Caucasian female, who was referred per therapist Jacelyn Pi, [place]); treatment for Bipolar 2.  Pt reports having issues with her "mental stability."  "I am on five meds and I feel like my mood is flucuating."  Pt states she was dx'd with Bipolar 2 in July 2019.  Reports being manic a lot; but recently struggling with depression since May 2020.  Triggers/Stressors:  1)  Relationship:  Got engaged in May 2020.  States they started arguing a lot d/t her flucuating mood.  "He felt I should be happy since we got engaged, but I've been depressed."  According to pt, fiance' wanted her to attend MH-IOP right away, but pt wanted to see her providers at least one more time before starting.  Pt saw the medication provider today; but fiance' kicked her out of their place last week.  With the assistance of her mother; pt moved into an apt this past Monday.  Pt's mother's name is on the lease and she signed a yrs lease; but now fiance' is wanting pt to move back into their apt now.  "He's telling me if I don't move back then he doesn't  want to con't in the relationship; but I can't break the lease that my mom just got."  Pt reports that her parents are very angry with the fiance'.  Pt reports two previous psychiatric admissions (ie. age 75 @ [facility] and age 69 at [facility]).  Pt has been seeing Jacelyn Pi, LCSW since March 2020 and [employer] (MD/NP/PA?) for med mgmt.  Admits to one suicide attempt at age 78 (OD).  Reports ~ 6 weeks ago after argument with fiance' swallowing #15 Benadryl.  "I wasn't trying to kill myself; he just made me so mad."  Hx of cutting back in high school; but did admit that she has started back superficially cutting at times on her shoulder.  Family hx:  Father (Depression and ETOH). Patients Currently Reported Symptoms/Problems: Racing toughts, poor concentration, sadness, decreased sleep (awakenings), decreased appetite, tearful, c/o feeling jittery, anxious to the point of pulling hairs on legs and pulling nails back), poor self esteem, irritable,excessive spending.  Pt will be discharging today from [facility].  She completed all MH-IOP days.  Reports overall feeling much better; but still is c/o "biploar swings" at times.  Denies SI/HI or A/V hallucinations. Pt has been working while attending the program.  "The groups were very helpful because I was able to see that I'm not the only one struggling.  It was great having the support." A:  D/C today.  F/U with Dr. Hermina Barters next week and Darius (therapist) 08/28/19, since current therapist is  on leave.  Encouraged support groups.  R:  Pt receptive.  Chestine Spore, RITA, M.Ed,CNA

## 2019-08-24 ENCOUNTER — Other Ambulatory Visit: Payer: Self-pay

## 2019-08-24 ENCOUNTER — Other Ambulatory Visit (HOSPITAL_COMMUNITY): Payer: BC Managed Care – PPO

## 2019-08-28 DIAGNOSIS — F314 Bipolar disorder, current episode depressed, severe, without psychotic features: Secondary | ICD-10-CM | POA: Diagnosis not present

## 2019-08-31 DIAGNOSIS — F411 Generalized anxiety disorder: Secondary | ICD-10-CM | POA: Diagnosis not present

## 2019-08-31 DIAGNOSIS — F314 Bipolar disorder, current episode depressed, severe, without psychotic features: Secondary | ICD-10-CM | POA: Diagnosis not present

## 2019-09-19 DIAGNOSIS — F314 Bipolar disorder, current episode depressed, severe, without psychotic features: Secondary | ICD-10-CM | POA: Diagnosis not present

## 2019-10-03 DIAGNOSIS — F314 Bipolar disorder, current episode depressed, severe, without psychotic features: Secondary | ICD-10-CM | POA: Diagnosis not present

## 2019-10-17 DIAGNOSIS — F314 Bipolar disorder, current episode depressed, severe, without psychotic features: Secondary | ICD-10-CM | POA: Diagnosis not present

## 2020-03-31 DIAGNOSIS — Z30430 Encounter for insertion of intrauterine contraceptive device: Secondary | ICD-10-CM | POA: Insufficient documentation

## 2020-09-24 ENCOUNTER — Other Ambulatory Visit: Payer: Self-pay

## 2020-09-24 ENCOUNTER — Ambulatory Visit (INDEPENDENT_AMBULATORY_CARE_PROVIDER_SITE_OTHER): Payer: BC Managed Care – PPO | Admitting: Dermatology

## 2020-09-24 DIAGNOSIS — L308 Other specified dermatitis: Secondary | ICD-10-CM

## 2020-09-24 MED ORDER — HYDROCORTISONE 2.5 % EX CREA: TOPICAL_CREAM | CUTANEOUS | 3 refills | Status: DC

## 2020-09-24 MED ORDER — CLOBETASOL PROPIONATE 0.05 % EX OINT: TOPICAL_OINTMENT | CUTANEOUS | 3 refills | Status: DC

## 2020-09-24 NOTE — Progress Notes (Signed)
   Follow-Up Visit   Subjective  Gina Sparks is a 24 y.o. female who presents for the following: Eczema.  Patient was being seen for eczema in the past and was using clobetasol 0.05% ointment. She advises the eczema is all over and would like to get a refill.   The following portions of the chart were reviewed this encounter and updated as appropriate:   Tobacco  Allergies  Meds  Problems  Med Hx  Surg Hx  Fam Hx      Review of Systems:  No other skin or systemic complaints except as noted in HPI or Assessment and Plan.  Objective  Well appearing patient in no apparent distress; mood and affect are within normal limits.  A focused examination was performed including face, neck, chest and back and legs, arms. Relevant physical exam findings are noted in the Assessment and Plan.  Objective  Chest, back, arms, legs, face: Scaly pink papules coalescing to plaques at legs, upper back, chest, arms and face   Assessment & Plan  Other eczema Chest, back, arms, legs, face  Chronic condition, no cure, only control. Currently flared.  Atopic dermatitis (eczema) is a chronic, relapsing, pruritic condition that can significantly affect quality of life. It is often associated with allergic rhinitis and/or asthma and can require treatment with topical medications, phototherapy, or in severe cases a biologic medication called Dupixent.   Restart clobetasol 0.05% ointment 1-2 times daily as needed for eczema. Avoid applying to face, groin, and axilla. Use as directed. Risk of skin atrophy with long-term use reviewed.   Start HC 2.5% cream to affected areas twice daily to face for 2-3 weeks then as needed. Avoid applying to face, groin, and axilla. Use as directed. Risk of skin atrophy with long-term use reviewed.   Topical steroids (such as triamcinolone, fluocinolone, fluocinonide, mometasone, clobetasol, halobetasol, betamethasone, hydrocortisone) can cause thinning and lightening of  the skin if they are used for too long in the same area. Your physician has selected the right strength medicine for your problem and area affected on the body. Please use your medication only as directed by your physician to prevent side effects.    Ordered Medications: clobetasol ointment (TEMOVATE) 0.05 % hydrocortisone 2.5 % cream  Return if symptoms worsen or fail to improve.  Anise Salvo, RMA, am acting as scribe for Darden Dates, MD .  Documentation: I have reviewed the above documentation for accuracy and completeness, and I agree with the above.  Darden Dates, MD

## 2020-09-24 NOTE — Patient Instructions (Addendum)
Topical steroids (such as triamcinolone, fluocinolone, fluocinonide, mometasone, clobetasol, halobetasol, betamethasone, hydrocortisone) can cause thinning and lightening of the skin if they are used for too long in the same area. Your physician has selected the right strength medicine for your problem and area affected on the body. Please use your medication only as directed by your physician to prevent side effects.   Gentle Skin Care Guide  1. Bathe no more than once a day.  2. Avoid bathing in hot water  3. Use a mild soap like Dove, Vanicream, Cetaphil, CeraVe. Can use Lever 2000 or Cetaphil antibacterial soap  4. Use soap only where you need it. On most days, use it under your arms, between your legs, and on your feet. Let the water rinse other areas unless visibly dirty.  5. When you get out of the bath/shower, use a towel to gently blot your skin dry, don't rub it.  6. While your skin is still a little damp, apply a moisturizing cream such as Vanicream, CeraVe, Cetaphil, Eucerin, Sarna lotion or plain Vaseline Jelly. For hands apply Neutrogena Norwegian Hand Cream or Excipial Hand Cream.  7. Reapply moisturizer any time you start to itch or feel dry.  8. Sometimes using free and clear laundry detergents can be helpful. Fabric softener sheets should be avoided. Downy Free & Gentle liquid, or any liquid fabric softener that is free of dyes and perfumes, it acceptable to use  9. If your doctor has given you prescription creams you may apply moisturizers over them     

## 2020-10-06 ENCOUNTER — Encounter: Payer: Self-pay | Admitting: Dermatology

## 2023-10-11 ENCOUNTER — Ambulatory Visit: Payer: BLUE CROSS/BLUE SHIELD

## 2023-10-11 DIAGNOSIS — B9689 Other specified bacterial agents as the cause of diseases classified elsewhere: Secondary | ICD-10-CM

## 2023-10-11 DIAGNOSIS — Z113 Encounter for screening for infections with a predominantly sexual mode of transmission: Secondary | ICD-10-CM

## 2023-10-11 DIAGNOSIS — N760 Acute vaginitis: Secondary | ICD-10-CM

## 2023-10-11 LAB — WET PREP FOR TRICH, YEAST, CLUE
Trichomonas Exam: NEGATIVE
Yeast Exam: NEGATIVE

## 2023-10-11 LAB — HM HIV SCREENING LAB: HM HIV Screening: NEGATIVE

## 2023-10-11 MED ORDER — METRONIDAZOLE 500 MG PO TABS: 500.0000 mg | ORAL_TABLET | Freq: Two times a day (BID) | ORAL | Status: AC

## 2023-10-11 NOTE — Progress Notes (Signed)
Pt is here for STD screening , Wet prep results reviewed with pt. The patient was dispensed metronidazole 500 mg 2x/day for 7 days today. I provided counseling today regarding the medication. We discussed the medication, the side effects and when to call clinic. Patient given the opportunity to ask questions. Questions answered. Condoms Given. Sonda Primes, RN.

## 2023-10-11 NOTE — Progress Notes (Cosign Needed)
[facility] Department  STI clinic/screening visit [address] [place] [address] [phone]  Subjective:  Gina Sparks is a 27 y.o. female being seen today for an STI screening visit. The patient reports they do not have symptoms.  Patient reports that they do not desire a pregnancy in the next year.   They reported they are not interested in discussing contraception today because she is happy with her IUD.  No LMP recorded. (Menstrual status: IUD).  Patient has the following medical conditions:   Patient Active Problem List   Diagnosis Date Noted   Dyspareunia in female 05/10/2018    Chief Complaint  Patient presents with   SEXUALLY TRANSMITTED DISEASE    Screening    HPI  Patient reports 1 of her partners called her to tell her she "gave him an STI" but the pt was not able to understand which STI he was referring to she thinks it started with "hydra- something".  Does the patient using douching products? No  Last HIV test per patient/review of record was No results found for: "HMHIVSCREEN" No results found for: "HIV"   Last HEPC test per patient/review of record was No results found for: "HMHEPCSCREEN" No components found for: "HEPC"   Last HEPB test per patient/review of record was No components found for: "HMHEPBSCREEN" No components found for: "HEPC"   Patient reports last pap was No results found for: "DIAGPAP" No results found for: "SPECADGYN"  Screening for MPX risk: Does the patient have an unexplained rash? No Is the patient MSM? No Does the patient endorse multiple sex partners or anonymous sex partners? Yes Did the patient have close or sexual contact with a person diagnosed with MPX? No Has the patient traveled outside the [place] where MPX is endemic? No Is there a high clinical suspicion for MPX-- evidenced by one of the following No  -Unlikely to be chickenpox  -Lymphadenopathy  -Rash that present in same phase of evolution on  any given body part See flowsheet for further details and programmatic requirements.   Immunization history:   There is no immunization history on file for this patient.   The following portions of the patient's history were reviewed and updated as appropriate: allergies, current medications, past medical history, past social history, past surgical history and problem list.  Objective:  There were no vitals filed for this visit.  Physical Exam Vitals and nursing note reviewed.  Constitutional:      Appearance: Normal appearance.  HENT:     Head: Normocephalic.     Mouth/Throat:     Mouth: Mucous membranes are moist.  Cardiovascular:     Rate and Rhythm: Normal rate.  Pulmonary:     Effort: Pulmonary effort is normal.  Abdominal:     Palpations: Abdomen is soft.  Genitourinary:    Comments: Declined genital exam- no symptoms, self swabbed Musculoskeletal:        General: Normal range of motion.  Lymphadenopathy:     Head:     Right side of head: No submandibular, preauricular or posterior auricular adenopathy.     Left side of head: No submandibular, preauricular or posterior auricular adenopathy.     Cervical: No cervical adenopathy.     Upper Body:     Right upper body: No supraclavicular or axillary adenopathy.     Left upper body: No supraclavicular or axillary adenopathy.  Skin:    General: Skin is warm and dry.     Findings:  Erythema (pt states from eczema) present.  Neurological:     Mental Status: She is alert and oriented to person, place, and time.  Psychiatric:        Mood and Affect: Mood normal.      Assessment and Plan:  DAQUANA HAMMELL is a 27 y.o. female presenting to the [facility] Department for STI screening  1. Screening for venereal disease (Primary) Pt reports anx/depression but desires to use a therapist she's seen in the past and declined a referral. Pt smokes and declines quitline info - Syphilis Serology, [facility] - HIV  [facility] - Chlamydia/Gonorrhea [facility] - WET PREP FOR TRICH, YEAST, CLUE   Patient accepted all screenings including oral, vaginal CT/GC and bloodwork for HIV/RPR, and wet prep. Patient meets criteria for HepB screening? No. Ordered? no Patient meets criteria for HepC screening? No. Ordered? no  Treat wet prep per standing order Discussed time line for [facility] results and that patient will be called with positive results and encouraged patient to call if she had not heard in 2 weeks.  Counseled to return or seek care for continued or worsening symptoms Recommended repeat testing in 3 months with positive results. Recommended condom use with all sex  Patient is currently using *Mirena to prevent pregnancy.    No follow-ups on file.  No future appointments.  Alicia Amel, NP

## 2023-12-23 ENCOUNTER — Other Ambulatory Visit: Payer: Self-pay

## 2023-12-23 ENCOUNTER — Emergency Department: Payer: Self-pay

## 2023-12-23 ENCOUNTER — Emergency Department
Admission: EM | Admit: 2023-12-23 | Discharge: 2023-12-23 | Disposition: A | Discharge status: Home / Self Care | Payer: Self-pay | Attending: Emergency Medicine | Admitting: Emergency Medicine

## 2023-12-23 DIAGNOSIS — R0781 Pleurodynia: Secondary | ICD-10-CM | POA: Insufficient documentation

## 2023-12-23 DIAGNOSIS — F410 Panic disorder [episodic paroxysmal anxiety] without agoraphobia: Secondary | ICD-10-CM | POA: Insufficient documentation

## 2023-12-23 LAB — CBC WITH DIFFERENTIAL/PLATELET
Abs Immature Granulocytes: 0.03 10*3/uL (ref 0.00–0.07)
Basophils Absolute: 0 10*3/uL (ref 0.0–0.1)
Basophils Relative: 0 %
Eosinophils Absolute: 0.3 10*3/uL (ref 0.0–0.5)
Eosinophils Relative: 2 %
HCT: 43.7 % (ref 36.0–46.0)
Hemoglobin: 14.5 g/dL (ref 12.0–15.0)
Immature Granulocytes: 0 %
Lymphocytes Relative: 11 %
Lymphs Abs: 1.2 10*3/uL (ref 0.7–4.0)
MCH: 31.4 pg (ref 26.0–34.0)
MCHC: 33.2 g/dL (ref 30.0–36.0)
MCV: 94.6 fL (ref 80.0–100.0)
Monocytes Absolute: 0.9 10*3/uL (ref 0.1–1.0)
Monocytes Relative: 8 %
Neutro Abs: 8.2 10*3/uL — ABNORMAL HIGH (ref 1.7–7.7)
Neutrophils Relative %: 79 %
Platelets: 301 10*3/uL (ref 150–400)
RBC: 4.62 MIL/uL (ref 3.87–5.11)
RDW: 11.9 % (ref 11.5–15.5)
WBC: 10.5 10*3/uL (ref 4.0–10.5)
nRBC: 0 % (ref 0.0–0.2)

## 2023-12-23 LAB — COMPREHENSIVE METABOLIC PANEL
ALT: 16 U/L (ref 0–44)
AST: 19 U/L (ref 15–41)
Albumin: 4.3 g/dL (ref 3.5–5.0)
Alkaline Phosphatase: 41 U/L (ref 38–126)
Anion gap: 11 (ref 5–15)
BUN: 8 mg/dL (ref 6–20)
CO2: 21 mmol/L — ABNORMAL LOW (ref 22–32)
Calcium: 9.2 mg/dL (ref 8.9–10.3)
Chloride: 104 mmol/L (ref 98–111)
Creatinine, Ser: 0.73 mg/dL (ref 0.44–1.00)
GFR, Estimated: >60 mL/min (ref >60)
Glucose, Bld: 101 mg/dL — ABNORMAL HIGH (ref 70–99)
Potassium: 3.8 mmol/L (ref 3.5–5.1)
Sodium: 136 mmol/L (ref 135–145)
Total Bilirubin: 1 mg/dL (ref 0.0–1.2)
Total Protein: 7.8 g/dL (ref 6.5–8.1)

## 2023-12-23 LAB — SALICYLATE LEVEL: Salicylate Lvl: <7 mg/dL — ABNORMAL LOW (ref 7.0–30.0)

## 2023-12-23 LAB — ACETAMINOPHEN LEVEL: Acetaminophen (Tylenol), Serum: 36 ug/mL — ABNORMAL HIGH (ref 10–30)

## 2023-12-23 LAB — HCG, QUANTITATIVE, PREGNANCY: hCG, Beta Chain, Quant, S: <1 m[IU]/mL (ref <5)

## 2023-12-23 MED ORDER — LIDOCAINE 5 % EX PTCH
1.0000 | MEDICATED_PATCH | CUTANEOUS | Status: DC
  Administered 2023-12-23: 1 via TRANSDERMAL
  Filled 2023-12-23: qty 1

## 2023-12-23 MED ORDER — OXYCODONE HCL 5 MG PO TABS
5.0000 mg | ORAL_TABLET | Freq: Once | ORAL | Status: AC
  Administered 2023-12-23: 5 mg via ORAL
  Filled 2023-12-23: qty 1

## 2023-12-23 MED ORDER — CYCLOBENZAPRINE HCL 5 MG PO TABS: 5.0000 mg | ORAL_TABLET | Freq: Three times a day (TID) | ORAL | 0 refills | Status: DC | PRN

## 2023-12-23 MED ORDER — LIDOCAINE 5 % EX PTCH: 1.0000 | MEDICATED_PATCH | Freq: Two times a day (BID) | CUTANEOUS | 0 refills | Status: DC

## 2023-12-23 MED ORDER — ONDANSETRON 4 MG PO TBDP
4.0000 mg | ORAL_TABLET | Freq: Once | ORAL | Status: AC
  Administered 2023-12-23: 4 mg via ORAL
  Filled 2023-12-23: qty 1

## 2023-12-23 NOTE — ED Provider Notes (Signed)
 [facility] Provider Note    Event Date/Time   First MD Initiated Contact with Patient 12/23/23 1115     (approximate)   History   Chief Complaint Rib Injury   HPI  Gina Sparks is a 28 y.o. female with past medical history of anxiety and depression who presents to the ED complaining of rib injury.  Patient reports that about 2 weeks ago she was having a panic attack and vomiting multiple times when she felt something pull in the left side of her chest.  She reports sharp pain in the left side of her chest ever since then, especially when moving or taking a deep breath.  She denies any associated fevers or cough and has not had any shortness of breath.  She has been taking Tylenol and ibuprofen without relief, does state that she took a larger dose of Tylenol and ibuprofen this morning to try to help with pain, also drank tequila last night.  She reports taking 5 tablets of 325 mg Tylenol and 5 tablets of 200 mg ibuprofen earlier this morning.     Physical Exam   Triage Vital Signs: ED Triage Vitals  Encounter Vitals Group     BP 12/23/23 1028 (!) 157/110     Systolic BP Percentile --      Diastolic BP Percentile --      Pulse Rate 12/23/23 1028 (!) 115     Resp 12/23/23 1028 (!) 22     Temp 12/23/23 1028 98.4 F (36.9 C)     Temp Source 12/23/23 1028 Oral     SpO2 12/23/23 1028 100 %     Weight 12/23/23 1027 123 lb 0.3 oz (55.8 kg)     Height --      Head Circumference --      Peak Flow --      Pain Score 12/23/23 1025 8     Pain Loc --      Pain Education --      Exclude from Growth Chart --     Most recent vital signs: Vitals:   12/23/23 1028  BP: (!) 157/110  Pulse: (!) 115  Resp: (!) 22  Temp: 98.4 F (36.9 C)  SpO2: 100%    Constitutional: Alert and oriented. Eyes: Conjunctivae are normal. Head: Atraumatic. Nose: No congestion/rhinnorhea. Mouth/Throat: Mucous membranes are moist.  Cardiovascular: Normal rate, regular  rhythm. Grossly normal heart sounds.  2+ radial pulses bilaterally. Respiratory: Normal respiratory effort.  No retractions. Lungs CTAB.  Left chest wall tenderness to palpation noted. Gastrointestinal: Soft and nontender. No distention. Musculoskeletal: No lower extremity tenderness nor edema.  Neurologic:  Normal speech and language. No gross focal neurologic deficits are appreciated.    ED Results / Procedures / Treatments   Labs (all labs ordered are listed, but only abnormal results are displayed) Labs Reviewed  CBC WITH DIFFERENTIAL/PLATELET - Abnormal; Notable for the following components:      Result Value   Neutro Abs 8.2 (*)    All other components within normal limits  COMPREHENSIVE METABOLIC PANEL - Abnormal; Notable for the following components:   CO2 21 (*)    Glucose, Bld 101 (*)    All other components within normal limits  ACETAMINOPHEN LEVEL - Abnormal; Notable for the following components:   Acetaminophen (Tylenol), Serum 36 (*)    All other components within normal limits  SALICYLATE LEVEL - Abnormal; Notable for the following components:   Salicylate Lvl <7.0 (*)  All other components within normal limits  HCG, QUANTITATIVE, PREGNANCY     EKG  ED ECG REPORT I, Chesley Noon, the attending physician, personally viewed and interpreted this ECG.   Date: 12/23/2023  EKG Time: 10:29  Rate: 85  Rhythm: normal sinus rhythm  Axis: Normal  Intervals:none  ST&T Change: None  RADIOLOGY Chest x-ray reviewed and interpreted by me with no infiltrate, edema, or effusion.  PROCEDURES:  Critical Care performed: No  Procedures   MEDICATIONS ORDERED IN ED: Medications  lidocaine (LIDODERM) 5 % 1 patch (1 patch Transdermal Patch Applied 12/23/23 1140)  ondansetron (ZOFRAN-ODT) disintegrating tablet 4 mg (4 mg Oral Given 12/23/23 1139)  oxyCODONE (Oxy IR/ROXICODONE) immediate release tablet 5 mg (5 mg Oral Given 12/23/23 1143)     IMPRESSION / MDM /  ASSESSMENT AND PLAN / ED COURSE  I reviewed the triage vital signs and the nursing notes.                              28 y.o. female with past medical history of anxiety and depression who presents to the ED complaining of left chest wall pain worsening over the past 2 weeks which started after an episode of vomiting.  Patient's presentation is most consistent with acute presentation with potential threat to life or bodily function.  Differential diagnosis includes, but is not limited to, rib fracture, hemothorax, pneumothorax, muscle strain, anxiety.  Patient nontoxic-appearing and in no acute distress, vital signs remarkable for tachycardia but otherwise reassuring.  EKG shows normal sinus rhythm with no ischemic changes, chest x-ray pending at this time.  Labs without significant anemia, leukocytosis, electrolyte abnormality, or AKI.  LFTs are also unremarkable, Tylenol insulate levels ordered from triage and are pending.  We will treat pain with Lidoderm patch and oxycodone, also give dose of Zofran as she has gotten nauseous with pain medication in the past.  X-ray imaging is unremarkable, salicylate level is undetectable.  Patient does have mild elevation in Tylenol level but doubt significant Tylenol toxicity based on her described dosing and normal LFTs.  Patient appropriate for discharge home with outpatient follow-up, suspect musculoskeletal pain and will prescribe Lidoderm patches and Flexeril.  She was counseled to return to the ED for new or worsening symptoms.  Patient agrees with plan.      FINAL CLINICAL IMPRESSION(S) / ED DIAGNOSES   Final diagnoses:  Rib pain on left side     Rx / DC Orders   ED Discharge Orders          Ordered    lidocaine (LIDODERM) 5 %  Every 12 hours        12/23/23 1321    cyclobenzaprine (FLEXERIL) 5 MG tablet  3 times daily PRN        12/23/23 1321             Note:  This document was prepared using Dragon voice recognition  software and may include unintentional dictation errors.   Chesley Noon, MD 12/23/23 1322

## 2023-12-23 NOTE — ED Triage Notes (Addendum)
 C/O panic attack 2.5 weeks and vomiting once and then had left rib pain.  ARrives today c/o continued left rib pain.  Denies current anxiety attack, but appears anxious, tearful in triage.  AAOx3.  Skin warm and dry. NAD  States took 5 200 mg ibuprofen and 5- 325 mg tylenol this morning for pain. Also tried tequila last night to help with pain.

## 2024-01-21 ENCOUNTER — Other Ambulatory Visit: Payer: Self-pay

## 2024-01-21 ENCOUNTER — Emergency Department
Admission: EM | Admit: 2024-01-21 | Discharge: 2024-01-21 | Disposition: A | Discharge status: Home / Self Care | Payer: Self-pay | Attending: Emergency Medicine | Admitting: Emergency Medicine

## 2024-01-21 DIAGNOSIS — L308 Other specified dermatitis: Secondary | ICD-10-CM

## 2024-01-21 DIAGNOSIS — L2089 Other atopic dermatitis: Secondary | ICD-10-CM | POA: Insufficient documentation

## 2024-01-21 LAB — CBC WITH DIFFERENTIAL/PLATELET
Abs Immature Granulocytes: 0.04 10*3/uL (ref 0.00–0.07)
Basophils Absolute: 0.1 10*3/uL (ref 0.0–0.1)
Basophils Relative: 1 %
Eosinophils Absolute: 1.3 10*3/uL — ABNORMAL HIGH (ref 0.0–0.5)
Eosinophils Relative: 12 %
HCT: 43.5 % (ref 36.0–46.0)
Hemoglobin: 14.4 g/dL (ref 12.0–15.0)
Immature Granulocytes: 0 %
Lymphocytes Relative: 18 %
Lymphs Abs: 2 10*3/uL (ref 0.7–4.0)
MCH: 31 pg (ref 26.0–34.0)
MCHC: 33.1 g/dL (ref 30.0–36.0)
MCV: 93.5 fL (ref 80.0–100.0)
Monocytes Absolute: 0.8 10*3/uL (ref 0.1–1.0)
Monocytes Relative: 7 %
Neutro Abs: 6.9 10*3/uL (ref 1.7–7.7)
Neutrophils Relative %: 62 %
Platelets: 365 10*3/uL (ref 150–400)
RBC: 4.65 MIL/uL (ref 3.87–5.11)
RDW: 13.2 % (ref 11.5–15.5)
WBC: 11.1 10*3/uL — ABNORMAL HIGH (ref 4.0–10.5)
nRBC: 0 % (ref 0.0–0.2)

## 2024-01-21 LAB — COMPREHENSIVE METABOLIC PANEL WITH GFR
ALT: 17 U/L (ref 0–44)
AST: 27 U/L (ref 15–41)
Albumin: 3.8 g/dL (ref 3.5–5.0)
Alkaline Phosphatase: 44 U/L (ref 38–126)
Anion gap: 11 (ref 5–15)
BUN: 8 mg/dL (ref 6–20)
CO2: 22 mmol/L (ref 22–32)
Calcium: 8.5 mg/dL — ABNORMAL LOW (ref 8.9–10.3)
Chloride: 106 mmol/L (ref 98–111)
Creatinine, Ser: 0.59 mg/dL (ref 0.44–1.00)
GFR, Estimated: >60 mL/min (ref >60)
Glucose, Bld: 78 mg/dL (ref 70–99)
Potassium: 4.2 mmol/L (ref 3.5–5.1)
Sodium: 139 mmol/L (ref 135–145)
Total Bilirubin: 0.4 mg/dL (ref 0.0–1.2)
Total Protein: 7.3 g/dL (ref 6.5–8.1)

## 2024-01-21 MED ORDER — METHYLPREDNISOLONE SODIUM SUCC 125 MG IJ SOLR
125.0000 mg | Freq: Once | INTRAMUSCULAR | Status: AC
  Administered 2024-01-21: 125 mg via INTRAVENOUS
  Filled 2024-01-21: qty 2

## 2024-01-21 MED ORDER — CLOBETASOL PROPIONATE 0.05 % EX OINT
1.0000 | TOPICAL_OINTMENT | Freq: Two times a day (BID) | CUTANEOUS | 0 refills | Status: DC
Start: 2024-01-21 — End: 2024-02-20

## 2024-01-21 MED ORDER — FAMOTIDINE IN NACL 20-0.9 MG/50ML-% IV SOLN
20.0000 mg | Freq: Once | INTRAVENOUS | Status: AC
  Administered 2024-01-21: 20 mg via INTRAVENOUS
  Filled 2024-01-21: qty 50

## 2024-01-21 MED ORDER — PREDNISONE 10 MG (48) PO TBPK: ORAL_TABLET | ORAL | 0 refills | Status: DC

## 2024-01-21 NOTE — ED Triage Notes (Signed)
 Pt to ED via POV from home. Pt reports increased rash all over body for the past 6 months. Pt reports hx of same and uses topical steroid creams. Pt reports rash is painful and itchy.   Pt with hx eczema

## 2024-01-21 NOTE — ED Notes (Signed)
 The redness on the pt's left arm and the left side of her face are noted to be diminished.

## 2024-01-21 NOTE — ED Notes (Signed)
 Pt states she has been having a rash all over her body for the past 6 months. PT is noted to have red and dry skin to arms,legs, abdomen and back. She denies eating anything out of the ordinary or changes to body wash and laundry detergent. States she had used steroids in the past and it has helped but dur to being off her parents insurance she is no longer to afford same.

## 2024-01-21 NOTE — ED Provider Notes (Signed)
 [facility] Provider Note    Event Date/Time   First MD Initiated Contact with Patient 01/21/24 1141     (approximate)   History   Rash   HPI  Gina Sparks is a 28 y.o. female history of anxiety, depression, ADHD presents emergency department with rash all over her body.  Patient states she has been red scaly and itchy for about 6 months.  She used topical steroid creams without any relief.  Unsure if she has ever had trouble with her hemoglobin and hematocrit.  States the red flushed areas do not go away      Physical Exam   Triage Vital Signs: ED Triage Vitals  Encounter Vitals Group     BP 01/21/24 1127 (!) 135/92     Systolic BP Percentile --      Diastolic BP Percentile --      Pulse Rate 01/21/24 1127 (!) 123     Resp 01/21/24 1127 20     Temp 01/21/24 1127 97.7 F (36.5 C)     Temp src --      SpO2 01/21/24 1127 100 %     Weight --      Height --      Head Circumference --      Peak Flow --      Pain Score 01/21/24 1125 7     Pain Loc --      Pain Education --      Exclude from Growth Chart --     Most recent vital signs: Vitals:   01/21/24 1345 01/21/24 1356  BP: 135/81   Pulse: (!) 113   Resp:    Temp:  97.7 F (36.5 C)  SpO2: 100%      General: Awake, no distress.   CV:  Good peripheral perfusion. regular rate and  rhythm Resp:  Normal effort. Lungs cta Abd:  No distention.   Other:  Skin with diffuse redness, scaling on the face, arms legs, not plaque-like, more like eczema and dry skin   ED Results / Procedures / Treatments   Labs (all labs ordered are listed, but only abnormal results are displayed) Labs Reviewed  CBC WITH DIFFERENTIAL/PLATELET - Abnormal; Notable for the following components:      Result Value   WBC 11.1 (*)    Eosinophils Absolute 1.3 (*)    All other components within normal limits  COMPREHENSIVE METABOLIC PANEL WITH GFR - Abnormal; Notable for the following components:   Calcium 8.5  (*)    All other components within normal limits     EKG     RADIOLOGY     PROCEDURES:   Procedures Chief Complaint  Patient presents with   Rash      MEDICATIONS ORDERED IN ED: Medications  methylPREDNISolone sodium succinate (SOLU-MEDROL) 125 mg/2 mL injection 125 mg (125 mg Intravenous Given 01/21/24 1217)  famotidine (PEPCID) IVPB 20 mg premix (0 mg Intravenous Stopped 01/21/24 1327)     IMPRESSION / MDM / ASSESSMENT AND PLAN / ED COURSE  I reviewed the triage vital signs and the nursing notes.                              Differential diagnosis includes, but is not limited to, eczema, psoriasis, hemochromatosis  Patient's presentation is most consistent with acute illness / injury with system symptoms.   Patient was given Solu-Medrol 125 mg IV, Pepcid IV,  basic labs   Patient had some relief with medications.  Will give her prescription clobetasol and prednisone and 12 day Dosepak.  Instructed her to follow-up with dermatology.  Patient is in agreement treatment plan.  Discharged stable condition.   FINAL CLINICAL IMPRESSION(S) / ED DIAGNOSES   Final diagnoses:  Other eczema     Rx / DC Orders   ED Discharge Orders          Ordered    clobetasol ointment (TEMOVATE) 0.05 %  2 times daily        01/21/24 1345    predniSONE (STERAPRED UNI-PAK 48 TAB) 10 MG (48) TBPK tablet       Note to Pharmacy: Can break into regular prednisone tablets if cheaper than dose pack   01/21/24 1345             Note:  This document was prepared using Dragon voice recognition software and may include unintentional dictation errors.    Faythe Ghee, PA-C 01/21/24 1554    Janith Lima, MD 01/21/24 [phone]

## 2024-01-22 ENCOUNTER — Telehealth: Payer: Self-pay | Admitting: Emergency Medicine

## 2024-01-22 MED ORDER — PREDNISONE 10 MG (48) PO TBPK
ORAL_TABLET | ORAL | 0 refills | Status: DC
Start: 2024-01-22 — End: 2024-02-20

## 2024-01-22 NOTE — Telephone Encounter (Cosign Needed)
 Patient called about Rx sent yesterday and states that [facility] needs it resent.

## 2024-02-16 ENCOUNTER — Other Ambulatory Visit: Payer: Self-pay

## 2024-02-20 ENCOUNTER — Other Ambulatory Visit: Payer: Self-pay

## 2024-02-20 ENCOUNTER — Emergency Department
Admission: EM | Admit: 2024-02-20 | Discharge: 2024-02-20 | Disposition: A | Discharge status: Home / Self Care | Payer: MEDICAID | Attending: Emergency Medicine | Admitting: Emergency Medicine

## 2024-02-20 DIAGNOSIS — E876 Hypokalemia: Secondary | ICD-10-CM | POA: Insufficient documentation

## 2024-02-20 DIAGNOSIS — L309 Dermatitis, unspecified: Secondary | ICD-10-CM | POA: Insufficient documentation

## 2024-02-20 DIAGNOSIS — R21 Rash and other nonspecific skin eruption: Secondary | ICD-10-CM

## 2024-02-20 LAB — CBC
HCT: 39.1 % (ref 36.0–46.0)
Hemoglobin: 13.1 g/dL (ref 12.0–15.0)
MCH: 31.6 pg (ref 26.0–34.0)
MCHC: 33.5 g/dL (ref 30.0–36.0)
MCV: 94.2 fL (ref 80.0–100.0)
Platelets: 343 10*3/uL (ref 150–400)
RBC: 4.15 MIL/uL (ref 3.87–5.11)
RDW: 13.4 % (ref 11.5–15.5)
WBC: 10.4 10*3/uL (ref 4.0–10.5)
nRBC: 0 % (ref 0.0–0.2)

## 2024-02-20 LAB — COMPREHENSIVE METABOLIC PANEL WITH GFR
ALT: 36 U/L (ref 0–44)
AST: 43 U/L — ABNORMAL HIGH (ref 15–41)
Albumin: 3.6 g/dL (ref 3.5–5.0)
Alkaline Phosphatase: 43 U/L (ref 38–126)
Anion gap: 10 (ref 5–15)
BUN: 6 mg/dL (ref 6–20)
CO2: 24 mmol/L (ref 22–32)
Calcium: 8.7 mg/dL — ABNORMAL LOW (ref 8.9–10.3)
Chloride: 106 mmol/L (ref 98–111)
Creatinine, Ser: 0.73 mg/dL (ref 0.44–1.00)
GFR, Estimated: >60 mL/min (ref >60)
Glucose, Bld: 89 mg/dL (ref 70–99)
Potassium: 3.2 mmol/L — ABNORMAL LOW (ref 3.5–5.1)
Sodium: 140 mmol/L (ref 135–145)
Total Bilirubin: 1.1 mg/dL (ref 0.0–1.2)
Total Protein: 6.7 g/dL (ref 6.5–8.1)

## 2024-02-20 MED ORDER — KETOROLAC TROMETHAMINE 15 MG/ML IJ SOLN: 15.0000 mg | Freq: Once | INTRAMUSCULAR | Status: DC

## 2024-02-20 MED ORDER — CLOBETASOL PROPIONATE 0.05 % EX OINT: 1.0000 | TOPICAL_OINTMENT | Freq: Two times a day (BID) | CUTANEOUS | 6 refills | Status: DC

## 2024-02-20 MED ORDER — SODIUM CHLORIDE 0.9 % IV BOLUS
1000.0000 mL | Freq: Once | INTRAVENOUS | Status: AC
  Administered 2024-02-20: 1000 mL via INTRAVENOUS

## 2024-02-20 MED ORDER — DIPHENHYDRAMINE HCL 50 MG/ML IJ SOLN
50.0000 mg | Freq: Once | INTRAMUSCULAR | Status: AC
  Administered 2024-02-20: 50 mg via INTRAVENOUS
  Filled 2024-02-20: qty 1

## 2024-02-20 MED ORDER — CLOBETASOL PROPIONATE 0.05 % EX OINT: 1.0000 | TOPICAL_OINTMENT | Freq: Two times a day (BID) | CUTANEOUS | 6 refills | Status: AC

## 2024-02-20 MED ORDER — DEXAMETHASONE SODIUM PHOSPHATE 10 MG/ML IJ SOLN
10.0000 mg | Freq: Once | INTRAMUSCULAR | Status: AC
  Administered 2024-02-20: 10 mg via INTRAVENOUS
  Filled 2024-02-20: qty 1

## 2024-02-20 MED ORDER — FAMOTIDINE IN NACL 20-0.9 MG/50ML-% IV SOLN
20.0000 mg | Freq: Once | INTRAVENOUS | Status: AC
  Administered 2024-02-20: 20 mg via INTRAVENOUS
  Filled 2024-02-20: qty 50

## 2024-02-20 MED ORDER — KETOROLAC TROMETHAMINE 15 MG/ML IJ SOLN
15.0000 mg | Freq: Once | INTRAMUSCULAR | Status: AC
  Administered 2024-02-20: 15 mg via INTRAVENOUS
  Filled 2024-02-20: qty 1

## 2024-02-20 MED ORDER — PREDNISONE 10 MG (48) PO TBPK: ORAL_TABLET | ORAL | 0 refills | Status: DC

## 2024-02-20 NOTE — ED Provider Notes (Signed)
 [facility] Emergency Department Provider Note     Event Date/Time   First MD Initiated Contact with Patient 02/20/24 1503     (approximate)   History   Rash   HPI  Gina Sparks Gina Sparks is a 28 y.o. female with a history of anxiety, depression, and ADHD presents to the ED for evaluation of a chronic rash, severely pruritic in nature, all over her body.  Patient reports history of eczema and frequent eczema flare ups since the age of 50.  No new medication use.  She reports clobetasol  cream and prednisone  provide moderate relief, but she recently ran out.  She reports she is experiencing chills but her body is warm.  Unknown fever.  Reports she has been unable to follow-up with dermatology.  Denies sun exposure.  Chart review -patient seen for the same in this ED 03/29  Physical Exam   Triage Vital Signs: ED Triage Vitals  Encounter Vitals Group     BP 02/20/24 1413 (!) 137/99     Systolic BP Percentile --      Diastolic BP Percentile --      Pulse Rate 02/20/24 1413 (!) 115     Resp 02/20/24 1413 18     Temp 02/20/24 1413 98.4 F (36.9 C)     Temp Source 02/20/24 1413 Oral     SpO2 02/20/24 1415 100 %     Weight --      Height --      Head Circumference --      Peak Flow --      Pain Score 02/20/24 1413 6     Pain Loc --      Pain Education --      Exclude from Growth Chart --     Most recent vital signs: Vitals:   02/20/24 1415 02/20/24 1735  BP:  (!) 136/96  Pulse:  (!) 112  Resp:  18  Temp:    SpO2: 100% 99%    General Awake, no distress.  HEENT NCAT.  Oropharynx is clear.  No mucosal lesions. CV:  Good peripheral perfusion.  RESP:  Normal effort.  ABD:  No distention.  Other:  Erythemic diffuse blanchable rash on all aspects of neck, arms, trunk, back, bilateral legs.  Moderate amount of dry and flaky skin on bilateral arms.  Negative sloughing sign.  No noted open lesions on skin.   ED Results / Procedures / Treatments    Labs (all labs ordered are listed, but only abnormal results are displayed) Labs Reviewed  COMPREHENSIVE METABOLIC PANEL WITH GFR - Abnormal; Notable for the following components:      Result Value   Potassium 3.2 (*)    Calcium 8.7 (*)    AST 43 (*)    All other components within normal limits  CBC    No results found.  PROCEDURES:  Critical Care performed: No  Procedures   MEDICATIONS ORDERED IN ED: Medications  sodium chloride 0.9 % bolus 1,000 mL (0 mLs Intravenous Stopped 02/20/24 1656)  dexamethasone (DECADRON) injection 10 mg (10 mg Intravenous Given 02/20/24 1553)  diphenhydrAMINE (BENADRYL) injection 50 mg (50 mg Intravenous Given 02/20/24 1553)  famotidine  (PEPCID ) IVPB 20 mg premix (0 mg Intravenous Stopped 02/20/24 1656)  ketorolac (TORADOL) 15 MG/ML injection 15 mg (15 mg Intravenous Given 02/20/24 1654)     IMPRESSION / MDM / ASSESSMENT AND PLAN / ED COURSE  I reviewed the triage vital signs and the nursing notes.  Clinical Course as of 02/20/24 1756  Mon Feb 20, 2024  1751 On reassessment patient reports feeling much better.  Redness has improved. [MH]    Clinical Course User Index [MH] Alvaro Augusta A, PA-C   28 y.o. female presents to the emergency department for evaluation and treatment of rash. See HPI for further details.   Differential diagnosis includes, but is not limited to eczema, dermatitis, sunburn, cellulitis  Patient's presentation is most consistent with acute complicated illness / injury requiring diagnostic workup.  Patient is alert and oriented.  She is initially tachycardic at 115 but this appears to be her baseline.  Physical exam findings are as stated above.  Plan will be to obtain labs Administer IV fluids, famotidine , Benadryl, Decadron and Toradol and reassess.  On reassessment patient states she feels much better.  Rash improved slightly.  I have encouraged patient to closely follow-up with  dermatology in which she verbalized understanding.  She is in stable condition at discharge.  ED return precautions discussed  FINAL CLINICAL IMPRESSION(S) / ED DIAGNOSES   Final diagnoses:  Rash  Eczema, unspecified type   Rx / DC Orders   ED Discharge Orders          Ordered    clobetasol  ointment (TEMOVATE ) 0.05 %  2 times daily,   Status:  Discontinued        02/20/24 1722    clobetasol  ointment (TEMOVATE ) 0.05 %  2 times daily        02/20/24 1746    predniSONE  (STERAPRED UNI-PAK 48 TAB) 10 MG (48) TBPK tablet       Note to Pharmacy: Can break into regular prednisone  tablets if cheaper than dose pack   02/20/24 1746           Note:  This document was prepared using Dragon voice recognition software and may include unintentional dictation errors.    Phyllis Breeze, Olar Santini A, PA-C 02/20/24 1756    Arline Bennett, MD 02/20/24 2141

## 2024-02-20 NOTE — Discharge Instructions (Signed)
 Please follow-up with dermatology.  Multiple dermatology providers have been listed for you above.

## 2024-02-20 NOTE — ED Notes (Signed)
 Pt verbalizes understanding of discharge instructions. Opportunity for questioning and answers were provided. Pt discharged from ED to home with friend.

## 2024-02-20 NOTE — ED Triage Notes (Signed)
 Pt to ED via POV from home. Pt reports rash all over body with hx contact dermatitis. Pt seen here on 3/29 for same and prescribed prednisone  with relief. Pt reports rash started to get worse 2 wks ago. Pt has rash all over body.

## 2024-04-05 ENCOUNTER — Ambulatory Visit: Payer: Self-pay | Admitting: Dermatology

## 2024-05-17 ENCOUNTER — Other Ambulatory Visit: Payer: Self-pay

## 2024-05-17 ENCOUNTER — Encounter: Payer: Self-pay | Admitting: Dermatology

## 2024-05-17 ENCOUNTER — Ambulatory Visit: Payer: Self-pay | Admitting: Dermatology

## 2024-05-17 DIAGNOSIS — Z7189 Other specified counseling: Secondary | ICD-10-CM | POA: Diagnosis not present

## 2024-05-17 DIAGNOSIS — Z79899 Other long term (current) drug therapy: Secondary | ICD-10-CM | POA: Diagnosis not present

## 2024-05-17 DIAGNOSIS — L209 Atopic dermatitis, unspecified: Secondary | ICD-10-CM

## 2024-05-17 DIAGNOSIS — J309 Allergic rhinitis, unspecified: Secondary | ICD-10-CM | POA: Insufficient documentation

## 2024-05-17 DIAGNOSIS — L2084 Intrinsic (allergic) eczema: Secondary | ICD-10-CM

## 2024-05-17 DIAGNOSIS — L2081 Atopic neurodermatitis: Secondary | ICD-10-CM

## 2024-05-17 MED ORDER — DUPILUMAB 300 MG/2ML ~~LOC~~ SOAJ
600.0000 mg | Freq: Once | SUBCUTANEOUS | Status: AC
  Administered 2024-05-17: 600 mg via SUBCUTANEOUS

## 2024-05-17 MED ORDER — DUPIXENT 300 MG/2ML ~~LOC~~ SOAJ: 300.0000 mg | SUBCUTANEOUS | 6 refills | Status: DC

## 2024-05-17 NOTE — Progress Notes (Signed)
    New Patient Visit   Subjective  Gina Sparks is a 28 y.o. female who presents for the following: Eczema all over, yrs, flared 07/2023, worse in winter, painful, itchy, Clobetasol  oint qd/bid, pt has been to ER 2 times starting 12/2023 for eczema, prednisone  taper x 2, been getting in saltwater pool, cetaphil lotion, clears with prednisone  taper then flares, in past has tried Tacrolimus, HC 2.5% cr  Patient accompanied by friend.  The following portions of the chart were reviewed this encounter and updated as appropriate: medications, allergies, medical history  Review of Systems:  No other skin or systemic complaints except as noted in HPI or Assessment and Plan.  Objective  Well appearing patient in no apparent distress; mood and affect are within normal limits.   A focused examination was performed of the following areas: Face, scalp, trunk, arms, legs  Relevant exam findings are noted in the Assessment and Plan.    Assessment & Plan   ATOPIC DERMATITIS, present since infancy, severe, recalcitrant, near erythrodermic, harming quality of life, causing difficulty sleeping and functioning Tried and failed Tacrolimus ointment, Clobetasol  ointment, mycophenolate, prednisone , methylprednisolone , dexamethasone , hydrocortisone  Face, neck, scalp, trunk, extremities Exam: Scaly pink papules coalescing to plaques on face scalp trunk extremities 75% BSA  Chronic and persistent condition with duration or expected duration over one year. Condition is bothersome/symptomatic for patient. Currently flared.   Atopic dermatitis (eczema) is a chronic, relapsing, pruritic condition that can significantly affect quality of life. It is often associated with allergic rhinitis and/or asthma and can require treatment with topical medications, phototherapy, or in severe cases biologic injectable medication (Dupixent ; Adbry) or Oral JAK inhibitors.  Treatment Plan: Discussed injections vs oral  medication Discussed Dupixent  Dupilumab  (Dupixent ) is a treatment given by injection for adults and children with moderate-to-severe atopic dermatitis. Goal is control of skin condition, not cure. It is given as 2 injections at the first dose followed by 1 injection ever 2 weeks thereafter.    Potential side effects include allergic reaction, face dermatitis, face redness, joint pain, injection site reactions and conjunctivitis (inflammation of the eyes).  The use of Dupixent  requires long term medication management, including periodic office visits.  Start Dupixent  600 mg today and then 300mg /75ml sq injections q 2 wks Dupixent  300mg /61ml 2 sq injections today to R and L abdomen.  Patient and friend taught how to self inject with training pen.  Patient injected R abdomen, SH injected L abdomen. Sample Lot [id number] 10/24/2025, additional samples x 2 boxes Lot [id number] 10/24/25 Dupixent  [employer] forms faxed.  Recommend gentle skin care.   INTRINSIC ATOPIC DERMATITIS   Related Medications Dupilumab  (DUPIXENT ) 300 MG/2ML SOAJ Inject 300 mg into the skin every 14 (fourteen) days. Starting at day 15 for maintenance. Dupilumab  SOAJ 600 mg  LONG-TERM USE OF HIGH-RISK MEDICATION   COUNSELING AND COORDINATION OF CARE   MEDICATION MANAGEMENT    Return in about 2 months (around 07/18/2024).  I, Grayce Saunas, RMA, am acting as scribe for Boneta Sharps, MD .   Documentation: I have reviewed the above documentation for accuracy and completeness, and I agree with the above.  Boneta Sharps, MD

## 2024-05-17 NOTE — Patient Instructions (Signed)

## 2024-05-21 ENCOUNTER — Other Ambulatory Visit: Payer: Self-pay

## 2024-05-21 MED ORDER — DUPIXENT 300 MG/2ML ~~LOC~~ SOAJ: 1.0000 | SUBCUTANEOUS | 5 refills | Status: AC

## 2024-05-21 NOTE — Progress Notes (Signed)
 Pharmacy requesting clarification on pens vs syringes.

## 2024-06-12 ENCOUNTER — Telehealth: Payer: Self-pay

## 2024-06-12 NOTE — Telephone Encounter (Signed)
 Patient left voicemail asking can we send referral to [name], Gina Sparks. She is trying to schedule herself but unable to do so without referral. Patient states in her voicemail she does not have current PCP and [facility] will accept referral from dermatologist.  Please advise.

## 2024-06-14 NOTE — Telephone Encounter (Signed)
 Per Dr. Claudene, OK to send information to [facility]. Aw

## 2024-06-14 NOTE — Telephone Encounter (Signed)
 Referral faxed to [facility]. aw

## 2024-06-22 ENCOUNTER — Inpatient Hospital Stay (HOSPITAL_COMMUNITY)
Admission: AD | Admit: 2024-06-22 | Discharge: 2024-06-28 | DRG: 885 | Disposition: A | Discharge status: Home / Self Care | Payer: MEDICAID | Source: Intra-hospital

## 2024-06-22 ENCOUNTER — Other Ambulatory Visit: Payer: Self-pay

## 2024-06-22 ENCOUNTER — Ambulatory Visit (HOSPITAL_COMMUNITY)
Admission: EM | Admit: 2024-06-22 | Discharge: 2024-06-22 | Disposition: A | Discharge status: Other Acute Inpatient Hospital | Attending: Psychiatry | Admitting: Psychiatry

## 2024-06-22 DIAGNOSIS — F1914 Other psychoactive substance abuse with psychoactive substance-induced mood disorder: Secondary | ICD-10-CM | POA: Diagnosis present

## 2024-06-22 DIAGNOSIS — F1210 Cannabis abuse, uncomplicated: Secondary | ICD-10-CM | POA: Diagnosis present

## 2024-06-22 DIAGNOSIS — Z5900 Homelessness unspecified: Secondary | ICD-10-CM | POA: Diagnosis not present

## 2024-06-22 DIAGNOSIS — Z79899 Other long term (current) drug therapy: Secondary | ICD-10-CM | POA: Diagnosis not present

## 2024-06-22 DIAGNOSIS — R45851 Suicidal ideations: Secondary | ICD-10-CM | POA: Diagnosis present

## 2024-06-22 DIAGNOSIS — F411 Generalized anxiety disorder: Secondary | ICD-10-CM | POA: Diagnosis present

## 2024-06-22 DIAGNOSIS — F3181 Bipolar II disorder: Secondary | ICD-10-CM | POA: Insufficient documentation

## 2024-06-22 DIAGNOSIS — F1024 Alcohol dependence with alcohol-induced mood disorder: Secondary | ICD-10-CM | POA: Insufficient documentation

## 2024-06-22 DIAGNOSIS — F4310 Post-traumatic stress disorder, unspecified: Secondary | ICD-10-CM | POA: Diagnosis present

## 2024-06-22 DIAGNOSIS — F1290 Cannabis use, unspecified, uncomplicated: Secondary | ICD-10-CM | POA: Insufficient documentation

## 2024-06-22 DIAGNOSIS — F1490 Cocaine use, unspecified, uncomplicated: Secondary | ICD-10-CM | POA: Insufficient documentation

## 2024-06-22 DIAGNOSIS — Z818 Family history of other mental and behavioral disorders: Secondary | ICD-10-CM

## 2024-06-22 DIAGNOSIS — Z803 Family history of malignant neoplasm of breast: Secondary | ICD-10-CM | POA: Diagnosis not present

## 2024-06-22 DIAGNOSIS — N3281 Overactive bladder: Secondary | ICD-10-CM | POA: Insufficient documentation

## 2024-06-22 DIAGNOSIS — F332 Major depressive disorder, recurrent severe without psychotic features: Secondary | ICD-10-CM | POA: Diagnosis present

## 2024-06-22 DIAGNOSIS — N390 Urinary tract infection, site not specified: Secondary | ICD-10-CM | POA: Diagnosis present

## 2024-06-22 DIAGNOSIS — F909 Attention-deficit hyperactivity disorder, unspecified type: Secondary | ICD-10-CM | POA: Insufficient documentation

## 2024-06-22 DIAGNOSIS — F17290 Nicotine dependence, other tobacco product, uncomplicated: Secondary | ICD-10-CM | POA: Diagnosis present

## 2024-06-22 DIAGNOSIS — G4733 Obstructive sleep apnea (adult) (pediatric): Secondary | ICD-10-CM | POA: Insufficient documentation

## 2024-06-22 DIAGNOSIS — F10239 Alcohol dependence with withdrawal, unspecified: Secondary | ICD-10-CM | POA: Diagnosis present

## 2024-06-22 DIAGNOSIS — F1410 Cocaine abuse, uncomplicated: Secondary | ICD-10-CM | POA: Diagnosis present

## 2024-06-22 DIAGNOSIS — F419 Anxiety disorder, unspecified: Secondary | ICD-10-CM | POA: Insufficient documentation

## 2024-06-22 DIAGNOSIS — F1020 Alcohol dependence, uncomplicated: Secondary | ICD-10-CM | POA: Diagnosis not present

## 2024-06-22 DIAGNOSIS — Z9151 Personal history of suicidal behavior: Secondary | ICD-10-CM | POA: Insufficient documentation

## 2024-06-22 DIAGNOSIS — Z5941 Food insecurity: Secondary | ICD-10-CM | POA: Diagnosis not present

## 2024-06-22 DIAGNOSIS — F1520 Other stimulant dependence, uncomplicated: Secondary | ICD-10-CM

## 2024-06-22 DIAGNOSIS — L309 Dermatitis, unspecified: Secondary | ICD-10-CM | POA: Insufficient documentation

## 2024-06-22 DIAGNOSIS — F1090 Alcohol use, unspecified, uncomplicated: Secondary | ICD-10-CM

## 2024-06-22 LAB — CBC WITH DIFFERENTIAL/PLATELET
Abs Immature Granulocytes: 0.04 K/uL (ref 0.00–0.07)
Basophils Absolute: 0 K/uL (ref 0.0–0.1)
Basophils Relative: 0 %
Eosinophils Absolute: 0.1 K/uL (ref 0.0–0.5)
Eosinophils Relative: 1 %
HCT: 40.3 % (ref 36.0–46.0)
Hemoglobin: 13.5 g/dL (ref 12.0–15.0)
Immature Granulocytes: 0 %
Lymphocytes Relative: 12 %
Lymphs Abs: 1.1 K/uL (ref 0.7–4.0)
MCH: 32 pg (ref 26.0–34.0)
MCHC: 33.5 g/dL (ref 30.0–36.0)
MCV: 95.5 fL (ref 80.0–100.0)
Monocytes Absolute: 0.7 K/uL (ref 0.1–1.0)
Monocytes Relative: 7 %
Neutro Abs: 7.5 K/uL (ref 1.7–7.7)
Neutrophils Relative %: 80 %
Platelets: 276 K/uL (ref 150–400)
RBC: 4.22 MIL/uL (ref 3.87–5.11)
RDW: 12.6 % (ref 11.5–15.5)
WBC: 9.4 K/uL (ref 4.0–10.5)
nRBC: 0 % (ref 0.0–0.2)

## 2024-06-22 LAB — COMPREHENSIVE METABOLIC PANEL WITH GFR
ALT: 32 U/L (ref 0–44)
AST: 34 U/L (ref 15–41)
Albumin: 3.8 g/dL (ref 3.5–5.0)
Alkaline Phosphatase: 37 U/L — ABNORMAL LOW (ref 38–126)
Anion gap: 13 (ref 5–15)
BUN: 6 mg/dL (ref 6–20)
CO2: 28 mmol/L (ref 22–32)
Calcium: 9.5 mg/dL (ref 8.9–10.3)
Chloride: 98 mmol/L (ref 98–111)
Creatinine, Ser: 0.81 mg/dL (ref 0.44–1.00)
GFR, Estimated: >60 mL/min (ref >60)
Glucose, Bld: 102 mg/dL — ABNORMAL HIGH (ref 70–99)
Potassium: 3.8 mmol/L (ref 3.5–5.1)
Sodium: 139 mmol/L (ref 135–145)
Total Bilirubin: 0.8 mg/dL (ref 0.0–1.2)
Total Protein: 6.5 g/dL (ref 6.5–8.1)

## 2024-06-22 LAB — URINALYSIS, ROUTINE W REFLEX MICROSCOPIC
Bilirubin Urine: NEGATIVE
Glucose, UA: NEGATIVE mg/dL
Hgb urine dipstick: NEGATIVE
Ketones, ur: 5 mg/dL — AB
Leukocytes,Ua: NEGATIVE
Nitrite: POSITIVE — AB
Protein, ur: NEGATIVE mg/dL
Specific Gravity, Urine: 1.017 (ref 1.005–1.030)
pH: 7 (ref 5.0–8.0)

## 2024-06-22 LAB — POCT URINE DRUG SCREEN - MANUAL ENTRY (I-SCREEN)
POC Amphetamine UR: NOT DETECTED
POC Buprenorphine (BUP): NOT DETECTED
POC Cocaine UR: NOT DETECTED
POC Marijuana UR: POSITIVE — AB
POC Methadone UR: NOT DETECTED
POC Methamphetamine UR: NOT DETECTED
POC Morphine: NOT DETECTED
POC Oxazepam (BZO): POSITIVE — AB
POC Oxycodone UR: NOT DETECTED
POC Secobarbital (BAR): NOT DETECTED

## 2024-06-22 LAB — LIPID PANEL
Cholesterol: 179 mg/dL (ref 0–200)
HDL: 107 mg/dL (ref >40)
LDL Cholesterol: 53 mg/dL (ref 0–99)
Total CHOL/HDL Ratio: 1.7 ratio
Triglycerides: 97 mg/dL (ref <150)
VLDL: 19 mg/dL (ref 0–40)

## 2024-06-22 LAB — POC URINE PREG, ED: Preg Test, Ur: NEGATIVE

## 2024-06-22 LAB — TSH: TSH: 1.326 u[IU]/mL (ref 0.350–4.500)

## 2024-06-22 LAB — MAGNESIUM: Magnesium: 1.9 mg/dL (ref 1.7–2.4)

## 2024-06-22 LAB — ETHANOL: Alcohol, Ethyl (B): <15 mg/dL (ref <15)

## 2024-06-22 MED ORDER — ALUM & MAG HYDROXIDE-SIMETH 200-200-20 MG/5ML PO SUSP: 30.0000 mL | ORAL | Status: DC | PRN

## 2024-06-22 MED ORDER — ADULT MULTIVITAMIN W/MINERALS CH
1.0000 | ORAL_TABLET | Freq: Every day | ORAL | Status: DC
  Administered 2024-06-22: 1 via ORAL
  Filled 2024-06-22: qty 1

## 2024-06-22 MED ORDER — LORAZEPAM 2 MG/ML IJ SOLN: 2.0000 mg | Freq: Three times a day (TID) | INTRAMUSCULAR | Status: DC | PRN

## 2024-06-22 MED ORDER — THIAMINE HCL 100 MG/ML IJ SOLN
100.0000 mg | Freq: Once | INTRAMUSCULAR | Status: AC
  Administered 2024-06-22: 100 mg via INTRAMUSCULAR
  Filled 2024-06-22: qty 2

## 2024-06-22 MED ORDER — MAGNESIUM HYDROXIDE 400 MG/5ML PO SUSP: 30.0000 mL | Freq: Every day | ORAL | Status: DC | PRN

## 2024-06-22 MED ORDER — DIPHENHYDRAMINE HCL 50 MG/ML IJ SOLN: 50.0000 mg | Freq: Three times a day (TID) | INTRAMUSCULAR | Status: DC | PRN

## 2024-06-22 MED ORDER — ADULT MULTIVITAMIN W/MINERALS CH
1.0000 | ORAL_TABLET | Freq: Every day | ORAL | Status: DC
  Administered 2024-06-23 – 2024-06-28 (×6): 1 via ORAL
  Filled 2024-06-22 (×6): qty 1

## 2024-06-22 MED ORDER — DIPHENHYDRAMINE HCL 50 MG PO CAPS: 50.0000 mg | ORAL_CAPSULE | Freq: Three times a day (TID) | ORAL | Status: DC | PRN

## 2024-06-22 MED ORDER — TRAZODONE HCL 50 MG PO TABS
50.0000 mg | ORAL_TABLET | Freq: Every evening | ORAL | Status: DC | PRN
  Administered 2024-06-22 – 2024-06-27 (×6): 50 mg via ORAL
  Filled 2024-06-22 (×7): qty 1

## 2024-06-22 MED ORDER — HALOPERIDOL 5 MG PO TABS
5.0000 mg | ORAL_TABLET | Freq: Three times a day (TID) | ORAL | Status: DC | PRN
  Administered 2024-06-26: 5 mg via ORAL
  Filled 2024-06-22: qty 1

## 2024-06-22 MED ORDER — LORAZEPAM 1 MG PO TABS
1.0000 mg | ORAL_TABLET | Freq: Four times a day (QID) | ORAL | Status: AC | PRN
  Administered 2024-06-23 – 2024-06-24 (×2): 1 mg via ORAL
  Filled 2024-06-22 (×2): qty 1

## 2024-06-22 MED ORDER — ONDANSETRON 4 MG PO TBDP
4.0000 mg | ORAL_TABLET | Freq: Four times a day (QID) | ORAL | Status: AC | PRN
  Administered 2024-06-23 – 2024-06-25 (×3): 4 mg via ORAL
  Filled 2024-06-22 (×3): qty 1

## 2024-06-22 MED ORDER — ACETAMINOPHEN 325 MG PO TABS: 650.0000 mg | ORAL_TABLET | Freq: Four times a day (QID) | ORAL | Status: DC | PRN

## 2024-06-22 MED ORDER — HYDROXYZINE HCL 25 MG PO TABS: 25.0000 mg | ORAL_TABLET | Freq: Four times a day (QID) | ORAL | Status: DC | PRN

## 2024-06-22 MED ORDER — LORAZEPAM 1 MG PO TABS: 1.0000 mg | ORAL_TABLET | Freq: Four times a day (QID) | ORAL | Status: DC | PRN

## 2024-06-22 MED ORDER — NICOTINE 21 MG/24HR TD PT24
21.0000 mg | MEDICATED_PATCH | Freq: Every day | TRANSDERMAL | Status: DC
  Administered 2024-06-22: 21 mg via TRANSDERMAL
  Filled 2024-06-22: qty 1

## 2024-06-22 MED ORDER — ONDANSETRON 4 MG PO TBDP: 4.0000 mg | ORAL_TABLET | Freq: Four times a day (QID) | ORAL | Status: DC | PRN

## 2024-06-22 MED ORDER — VITAMIN B-1 100 MG PO TABS
100.0000 mg | ORAL_TABLET | Freq: Every day | ORAL | Status: DC
  Administered 2024-06-23 – 2024-06-28 (×6): 100 mg via ORAL
  Filled 2024-06-22 (×6): qty 1

## 2024-06-22 MED ORDER — CLOBETASOL PROPIONATE 0.05 % EX OINT
1.0000 | TOPICAL_OINTMENT | Freq: Two times a day (BID) | CUTANEOUS | Status: DC
Start: 2024-06-22 — End: 2024-06-22
  Filled 2024-06-22: qty 15

## 2024-06-22 MED ORDER — CLOBETASOL PROPIONATE 0.05 % EX OINT
1.0000 | TOPICAL_OINTMENT | Freq: Two times a day (BID) | CUTANEOUS | Status: DC
  Administered 2024-06-22 – 2024-06-23 (×2): 1 via TOPICAL
  Filled 2024-06-22: qty 15

## 2024-06-22 MED ORDER — LOPERAMIDE HCL 2 MG PO CAPS: 2.0000 mg | ORAL_CAPSULE | ORAL | Status: DC | PRN

## 2024-06-22 MED ORDER — HALOPERIDOL LACTATE 5 MG/ML IJ SOLN: 5.0000 mg | Freq: Three times a day (TID) | INTRAMUSCULAR | Status: DC | PRN

## 2024-06-22 MED ORDER — HALOPERIDOL LACTATE 5 MG/ML IJ SOLN: 10.0000 mg | Freq: Three times a day (TID) | INTRAMUSCULAR | Status: DC | PRN

## 2024-06-22 MED ORDER — TRAZODONE HCL 50 MG PO TABS: 50.0000 mg | ORAL_TABLET | Freq: Every evening | ORAL | Status: DC | PRN

## 2024-06-22 MED ORDER — ACETAMINOPHEN 325 MG PO TABS
650.0000 mg | ORAL_TABLET | Freq: Four times a day (QID) | ORAL | Status: DC | PRN
  Administered 2024-06-25 – 2024-06-27 (×2): 650 mg via ORAL
  Filled 2024-06-22 (×2): qty 2

## 2024-06-22 MED ORDER — THIAMINE MONONITRATE 100 MG PO TABS: 100.0000 mg | ORAL_TABLET | Freq: Every day | ORAL | Status: DC

## 2024-06-22 MED ORDER — DIPHENHYDRAMINE HCL 25 MG PO CAPS
50.0000 mg | ORAL_CAPSULE | Freq: Three times a day (TID) | ORAL | Status: DC | PRN
  Administered 2024-06-26: 50 mg via ORAL
  Filled 2024-06-22: qty 2

## 2024-06-22 MED ORDER — HALOPERIDOL 5 MG PO TABS: 5.0000 mg | ORAL_TABLET | Freq: Three times a day (TID) | ORAL | Status: DC | PRN

## 2024-06-22 MED ORDER — HYDROXYZINE HCL 25 MG PO TABS
25.0000 mg | ORAL_TABLET | Freq: Four times a day (QID) | ORAL | Status: AC | PRN
  Administered 2024-06-22 – 2024-06-25 (×5): 25 mg via ORAL
  Filled 2024-06-22 (×6): qty 1

## 2024-06-22 MED ORDER — LOPERAMIDE HCL 2 MG PO CAPS: 2.0000 mg | ORAL_CAPSULE | ORAL | Status: AC | PRN

## 2024-06-22 MED ORDER — NICOTINE 21 MG/24HR TD PT24
21.0000 mg | MEDICATED_PATCH | Freq: Every day | TRANSDERMAL | Status: DC
  Administered 2024-06-23 – 2024-06-28 (×6): 21 mg via TRANSDERMAL
  Filled 2024-06-22 (×5): qty 1

## 2024-06-22 NOTE — ED Notes (Signed)
 Pt presented to [facility] voluntarily and admitted to continuous assessment unit w/ c/o manic behavior, alcohol problem and depression. Hx includes: bipolar 2, anxiety, PTSD, insomnia, ADHD, adjustment disorder, previous suicide attempts at age 28. Pt reports coming into [facility] for worsening depression, SI, and alcohol use. Pt has been experiencing worsening depression and SI since Monday. She has had thoughts of wanting to jump out of her truck. Reports drinking alcohol daily and drinks about 1/5 of vodka daily. Last drink was on Wednesday. Pt is also currently homeless and living  on her ex-boyfriend's couch. Denies current SI/HI/AVH. Pt is calm and cooperative. Skin assessment: remarkable for old linear scars to L wrist . Oriented to unit. Denies need of anything at this time. Pt in NAD at this time. Encouragement and support given. Will continue to monitor.

## 2024-06-22 NOTE — ED Notes (Signed)
 Report called to [facility].

## 2024-06-22 NOTE — ED Notes (Signed)
 Pt a/o socializing with peer and watching tv. Denies SI/HI/AVH. Voices c/o mild tremors and anxiety due to withdrawal symptoms. No noted distress. Will continue to monitor for safety.  She is pleasant and engaged.
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
## 2024-06-22 NOTE — ED Notes (Signed)
 [facility] notified of need to transfer to [facility]

## 2024-06-22 NOTE — Discharge Instructions (Addendum)
 Patient transferred to [facility] for inpatient treatment

## 2024-06-22 NOTE — Progress Notes (Signed)
 Pt has been accepted to [facility] on 06/22/2024 . Bed assignment: TBD  Pt meets inpatient criteria per Alan Mcardle, NP   Attending Physician will be Dr. Raliegh   Report can be called to: Adult unit: [phone]  Pt can arrive at 8 PM   Care Team Notified: [facility] Burnard Barter, RN, Alan Mcardle, NP, Damien Fireman, RN
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
## 2024-06-23 ENCOUNTER — Encounter (HOSPITAL_COMMUNITY): Payer: Self-pay | Admitting: Psychiatry

## 2024-06-23 DIAGNOSIS — F1520 Other stimulant dependence, uncomplicated: Secondary | ICD-10-CM

## 2024-06-23 DIAGNOSIS — F1020 Alcohol dependence, uncomplicated: Principal | ICD-10-CM

## 2024-06-23 DIAGNOSIS — F4310 Post-traumatic stress disorder, unspecified: Secondary | ICD-10-CM | POA: Insufficient documentation

## 2024-06-23 LAB — HEMOGLOBIN A1C
Hgb A1c MFr Bld: 4.7 % — ABNORMAL LOW (ref 4.8–5.6)
Mean Plasma Glucose: 88 mg/dL

## 2024-06-23 MED ORDER — DULOXETINE HCL 30 MG PO CPEP
30.0000 mg | ORAL_CAPSULE | Freq: Every day | ORAL | Status: DC
  Administered 2024-06-23 – 2024-06-28 (×6): 30 mg via ORAL
  Filled 2024-06-23 (×6): qty 1

## 2024-06-23 MED ORDER — CLOBETASOL PROPIONATE 0.05 % EX OINT
1.0000 | TOPICAL_OINTMENT | Freq: Two times a day (BID) | CUTANEOUS | Status: DC | PRN
  Administered 2024-06-23: 1 via TOPICAL
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
## 2024-06-23 NOTE — Progress Notes (Signed)
(  Sleep Hours) - 5.5 (Any PRNs that were needed, meds refused, or side effects to meds)- PRN vistaril  25 mg and trazodone  50 mg given at pt request, no meds refused.  (Any disturbances and when (visitation, over night)- None  (Concerns raised by the patient)- None  (SI/HI/AVH)- Denies SI/HI/AVH
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
## 2024-06-23 NOTE — Group Note (Signed)
 Date:  06/23/2024 Time:  9:27 AM  Group Topic/Focus:  Goals Group:   The focus of this group is to help patients establish daily goals to achieve during treatment and discuss how the patient can incorporate goal setting into their daily lives to aide in recovery.    Participation Level:  Did Not Attend    Mateja Dier A Keziah Drotar 06/23/2024, 9:27 AM

## 2024-06-23 NOTE — Plan of Care (Signed)
   Problem: Education: Goal: Knowledge of [place] [facility] Education information/materials will improve Outcome: Progressing Goal: Emotional status will improve Outcome: Progressing Goal: Mental status will improve Outcome: Progressing Goal: Verbalization of understanding the information provided will improve Outcome: Progressing

## 2024-06-23 NOTE — Group Note (Signed)
 LCSW Group Therapy Note   Group Date: 06/23/2024 Start Time: 0100 End Time: 0200   Type of Therapy and Topic:  Group Therapy: Communication  Participation Level:  Did Not Attend  Description of Group: In this group patients will be encouraged to explore how individuals communicate with one another appropriately and in appropriately. The patients will be guided to discuss their thoughts, feelings and behaviors related to barriers communicating feelings, needs and stressors. The group will process together ways to execute positive and appropriate communications, with attention given to how one-use behaviors, tone and body languages to communicate. Each patient will be encouraged to identify specific changes that are authority, and parents. This group will be process-oriented, with patients participating in exploration of their own experiences as well as giving and receiving support and challenging self as well as other group members.   Therapeutic Goals:  1.  Patient will identify how people communicate (body language, facial expression and electronic) Also discuss tone, voice and how this impact what is communication and how they message is perceived.   2.  Patient will identify feelings (such as fear and worry) thought process and behaviors related to why people internalize feelings rather than express self openly 3.  Patient will identify two changes they are willing to make to overcome communication barriers 4.  Patient will demonstrate ability to communicate their needs and set boundaries  through discussion  Summary of Patient Progress: NA

## 2024-06-23 NOTE — Progress Notes (Signed)
   06/23/24 0823  Psych Admission Type (Psych Patients Only)  Admission Status Voluntary  Psychosocial Assessment  Patient Complaints Malaise  Eye Contact Fair  Facial Expression Animated  Affect Appropriate to circumstance  Speech Logical/coherent  Interaction Assertive  Motor Activity Fidgety  Appearance/Hygiene Unremarkable  Behavior Characteristics Cooperative  Mood Pleasant  Thought Process  Coherency Tangential  Content WDL  Delusions WDL  Perception WDL  Hallucination None reported or observed  Judgment WDL  Confusion WDL  Danger to Self  Current suicidal ideation? Denies  Description of Suicide Plan Denies  Agreement Not to Harm Self Yes  Description of Agreement Verbal  Danger to Others  Danger to Others None reported or observed

## 2024-06-23 NOTE — Plan of Care (Signed)

## 2024-06-23 NOTE — Progress Notes (Signed)
 Admission Note:patient is a  VOL 28 year old female. Pt is alert and oriented x4. Pt stated she currently iexperiencing homelessness, financial difficulties, and interpersonal conflict with friends. Pt was calm and cooperative during assessment. Pt denied SI/HI/AVH. Admission plan of care reviewed with pt, consent signed.  Personal belongings/skin assessment completed.  Pt skin was intact, but was found to be be dry and flaky due to dx of eczema. No contraband found.  Patient oriented to the unit, staff and room.  Routine safety checks initiated. Verbalizes understanding of unit rules/protocols.Patient is presently safe on the unit. No unsafe behaviors noted. Q 15 minute safety checks maintained per unit protocol.

## 2024-06-23 NOTE — Tx Team (Signed)
 Initial Treatment Plan 06/23/2024 12:13 AM Gina Sparks FMW:[id number]    PATIENT STRESSORS: Financial difficulties   Loss of housing     PATIENT STRENGTHS: Active sense of humor  Communication skills  Motivation for treatment/growth    PATIENT IDENTIFIED PROBLEMS: homelessness  Financial difficulties                   DISCHARGE CRITERIA:  Adequate post-discharge living arrangements Improved stabilization in mood, thinking, and/or behavior Verbal commitment to aftercare and medication compliance  PRELIMINARY DISCHARGE PLAN: Attend 12-step recovery group Outpatient therapy  PATIENT/FAMILY INVOLVEMENT: This treatment plan has been presented to and reviewed with the patient, [name].  The patient have been given the opportunity to ask questions and make suggestions.  Revonda DELENA Land, RN 06/23/2024, 12:13 AM

## 2024-06-23 NOTE — Group Note (Signed)
 Date:  06/23/2024 Time:  4:00 PM  Group Topic/Focus:  Healthy vs Unhealthy Coping Strategies     Participation Level:  Minimal  Participation Quality:  Pt attempted to attend but was pulled out of group by staff   Affect:  Appropriate  Cognitive:  Alert  Insight: None  Engagement in Group:  Pt attempted to attend.   Modes of Intervention:  Activity and Discussion  Additional Comments:    Taaliyah Delpriore M Calib Wadhwa 06/23/2024, 4:00 PM

## 2024-06-23 NOTE — Group Note (Signed)
 Date:  06/23/2024 Time:  9:10 PM  Group Topic/Focus:  Wrap-Up Group:   The focus of this group is to help patients review their daily goal of treatment and discuss progress on daily workbooks.    Participation Level:  Active  Participation Quality:  Appropriate  Affect:  Appropriate  Cognitive:  Appropriate  Insight: Improving  Engagement in Group:  Engaged  Modes of Intervention:  Discussion  Additional Comments:  Pt attended the evening wrap-up group. Tech introduced the staff for the evening, reminded group of the evening schedule and reminded them to ask for anything they need. Pt read and discussed a poem named Autobiography in [employer]. Pt shared their understanding of the poem.  Rutherford PARAS Spenser Harren 06/23/2024, 9:10 PM
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
## 2024-06-24 NOTE — [facility] Suicide Risk Assessment (Deleted)
 [facility] INPATIENT:  Family/Significant Other Suicide Prevention Education  Suicide Prevention Education:  Contact Attempts: Irina Okelly (dad) [phone], Denny, Lave (step mom) [phone] been identified by the patient as the family member/significant other with whom the patient will be residing, and identified as the person(s) who will aid the patient in the event of a mental health crisis.  With written consent from the patient, two attempts were made to provide suicide prevention education, prior to and/or following the patient's discharge.  We were unsuccessful in providing suicide prevention education.  A suicide education pamphlet was given to the patient to share with family/significant other.  Date and time of first attempt: 8/31 at 9:25am Date and time of second attempt:needed  Gina Sparks 06/24/2024, 9:26 AM

## 2024-06-24 NOTE — Plan of Care (Signed)
   Problem: Education: Goal: Emotional status will improve Outcome: Progressing Goal: Mental status will improve Outcome: Progressing Goal: Verbalization of understanding the information provided will improve Outcome: Progressing   Problem: Activity: Goal: Interest or engagement in activities will improve Outcome: Progressing

## 2024-06-24 NOTE — [facility] Group Notes (Signed)
 [facility] Group Notes:  (Nursing/MHT/Case Management/Adjunct)  Date:  06/24/2024  Time:  2:36 PM  Type of Therapy:  Bingo  Participation Level:  Active  Participation Quality:  Appropriate  Affect:  Appropriate  Cognitive:  Appropriate  Insight:  Appropriate  Engagement in Group:  Engaged  Modes of Intervention:  Discussion  Summary of Progress/Problems:  Patient attended and participated in [place].   [name] 06/24/2024, 2:36 PM

## 2024-06-24 NOTE — [facility] Suicide Risk Assessment (Signed)
 [facility] INPATIENT:  Family/Significant Other Suicide Prevention Education  Suicide Prevention Education:  Education Completed; Gina Sparks (dad)  [phone],  has been identified by the patient as the family member/significant other with whom the patient will be residing, and identified as the person(s) who will aid the patient in the event of a mental health crisis (suicidal ideations/suicide attempt).  With written consent from the patient, the family member/significant other has been provided the following suicide prevention education, prior to the and/or following the discharge of the patient.  The suicide prevention education provided includes the following: Suicide risk factors Suicide prevention and interventions National Suicide Hotline telephone number [facility] assessment telephone number [facility] Emergency Assistance 911 [facility] and/or Residential Mobile Crisis Unit telephone number  Request made of family/significant other to: Remove weapons (e.g., guns, rifles, knives), all items previously/currently identified as safety concern.   Remove drugs/medications (over-the-counter, prescriptions, illicit drugs), all items previously/currently identified as a safety concern.  The family member/significant other verbalizes understanding of the suicide prevention education information provided.  The family member/significant other agrees to remove the items of safety concern listed above.  The LCSWA contacted the patient dad to provide SPI. The patient dad reported that there are weapons in the home but have them locked away and the patient will not have access. Dad stated that he has concerns about the patient hurting her self because of a text message she sent about ending her life. Dad reported that the patient has been using since the age of 26 or 58. Stating that she went througha detox program 6 months ago but went to stay with friends that uses. Dad informed  LCSWA that for the patient to be able to come live with him she would have to go to a treatment program first. Stating that he has set the patient up with [facility] for [facility] 6 month program but the patient would have to contact them herself and agree. Dad ask that we encourage the patient because patient has told him that if she hears it from professionals she would consider going.    Gina Sparks 06/24/2024, 9:30 AM

## 2024-06-24 NOTE — Progress Notes (Signed)
(  Sleep Hours) - 8.5 (Any PRNs that were needed, meds refused, or side effects to meds)- PRN vistaril  25 mg and trazodone  50 mg given at pt request, no meds refused.  (Any disturbances and when (visitation, over night)- None  (Concerns raised by the patient)- None (SI/HI/AVH)- Denies SI/HI/AVH

## 2024-06-24 NOTE — Progress Notes (Signed)
   06/24/24 0900  Psych Admission Type (Psych Patients Only)  Admission Status Voluntary  Psychosocial Assessment  Patient Complaints Malaise  Eye Contact Fair  Facial Expression Animated  Affect Anxious  Speech Logical/coherent  Interaction Assertive  Motor Activity Fidgety  Appearance/Hygiene Unremarkable  Behavior Characteristics Cooperative  Mood Anxious;Pleasant  Thought Process  Coherency WDL  Content WDL  Delusions None reported or observed  Perception WDL  Hallucination None reported or observed  Judgment WDL  Confusion None  Danger to Self  Current suicidal ideation? Denies  Description of Suicide Plan No Plan  Agreement Not to Harm Self Yes  Description of Agreement Verbal  Danger to Others  Danger to Others None reported or observed

## 2024-06-24 NOTE — Group Note (Signed)
 Date:  06/24/2024 Time:  4:10 PM  Group Topic/Focus:    The focus of this group is to introduce collage as a creative outlet for expressing emotions, reducing anxiety, fostering group cohesion and enabling personal insight.     Participation Level:  Active  Participation Quality:  Appropriate and Attentive  Affect:  Appropriate  Cognitive:  Alert and Appropriate  Insight: Appropriate and Good  Engagement in Group:  Engaged  Modes of Intervention:  Activity, Discussion, Exploration, Rapport Building, Socialization, and Support  Additional Comments:    Gina Sparks 06/24/2024, 4:10 PM

## 2024-06-24 NOTE — Progress Notes (Signed)
   06/24/24 0009  Psych Admission Type (Psych Patients Only)  Admission Status Voluntary  Psychosocial Assessment  Patient Complaints Malaise  Eye Contact Fair  Facial Expression Animated  Affect Anxious  Speech Logical/coherent  Interaction Assertive  Motor Activity Fidgety  Appearance/Hygiene Unremarkable  Behavior Characteristics Cooperative  Mood Anxious;Pleasant  Thought Process  Coherency WDL  Content WDL  Delusions None reported or observed  Perception WDL  Hallucination None reported or observed  Judgment WDL  Confusion None  Danger to Self  Current suicidal ideation? Denies  Agreement Not to Harm Self Yes  Description of Agreement Verbal  Danger to Others  Danger to Others None reported or observed

## 2024-06-24 NOTE — Plan of Care (Signed)
   Problem: Education: Goal: Knowledge of [place] [facility] Education information/materials will improve Outcome: Progressing Goal: Emotional status will improve Outcome: Progressing Goal: Mental status will improve Outcome: Progressing

## 2024-06-24 NOTE — Plan of Care (Signed)
   Problem: Education: Goal: Emotional status will improve Outcome: Progressing Goal: Mental status will improve Outcome: Progressing Goal: Verbalization of understanding the information provided will improve Outcome: Progressing

## 2024-06-24 NOTE — Progress Notes (Signed)
 [facility] MD Progress Note  06/24/2024 12:59 PM KATYRA TOMASSETTI  MRN:  [id number]  Principal Problem: Alcohol use disorder, severe, dependence (HCC) Diagnosis: Principal Problem:   Alcohol use disorder, severe, dependence (HCC) Active Problems:   Generalized anxiety disorder   Major depressive disorder, recurrent episode, severe (HCC)   Severe stimulant use disorder (HCC)   PTSD (post-traumatic stress disorder)   Reason for Admission:  Gina Sparks is a 28 y.o., female with history of stimulant use disorder-cocaine, alcohol use disorder, major depressive disorder, PTSD, ADHD, substance-induced bipolar disorder presents to behavioral health hospital requesting assistance to address alcohol dependence (admitted on 06/22/2024, total  LOS: 2 days )   ASSESSMENT: Patient has been tolerating Cymbalta  well since initiation without major somatic complaints.  Alcohol withdrawal symptoms minimal at this time.  Will continue to monitor for a few days while ensuring she has an appropriate disposition to address continued support for alcohol use disorder.   PLAN: Alcohol use disorder Cocaine use disorder Nicotine  dependence Substance-induced mood disorder Posttraumatic stress disorder Last alcoholic drink 8/28 - Continue Cymbalta  30 mg daily for PTSD - CIWA with Ativan  as needed (last CIWA 0) - Nicotine  patch and gum for NRT  -PRNs: maalox, milk of magnesia, hydroxyzine , trazodone  -- As needed agitation protocol in-place  Disposition Planning: -- Estimated LOS: 5-7 days --Estimated Discharge Date 9/4 --Barriers to Discharge: alcohol withdrawal, depression -- Discharge Goals: Return home with outpatient referrals for mental health follow-up including medication management/psychotherapy  Subjective: The patient was seen and evaluated on the unit. On assessment today the patient reports minimal withdrawal symptoms at this time after vomiting yesterday lunch.  Did report eating better today  although does report fatigue as she had to wake up very early as opposed to her normal time of 9 AM.  Denies any acute somatic complaints from initiation of duloxetine .  Remains interested in only CDIOP. Denies SI/HI/AVH   Objective: Chart Review from last 24 hours:  The patient's chart was reviewed and nursing notes were reviewed. The patient's case was discussed in multidisciplinary team meeting.  - Overnight events to report per chart review / staff report: none - Patient took all prescribed medications yes - Patient received the following PRNs: Hydroxyzine , Ativan , Zofran , trazodone   Current Medications: Current Facility-Administered Medications  Medication Dose Route Frequency Provider Last Rate Last Admin   acetaminophen  (TYLENOL ) tablet 650 mg  650 mg Oral Q6H PRN Brent, Amanda C, NP       alum & mag hydroxide-simeth (MAALOX/MYLANTA) 200-200-20 MG/5ML suspension 30 mL  30 mL Oral Q4H PRN Brent, Amanda C, NP       clobetasol  ointment (TEMOVATE ) 0.05 % 1 Application  1 Application Topical BID PRN Lynnette Barter, MD   1 Application at 06/23/24 2109   haloperidol  (HALDOL ) tablet 5 mg  5 mg Oral TID PRN Brent, Amanda C, NP       And   diphenhydrAMINE  (BENADRYL ) capsule 50 mg  50 mg Oral TID PRN Brent, Amanda C, NP       haloperidol  lactate (HALDOL ) injection 5 mg  5 mg Intramuscular TID PRN Thresa Alan BROCKS, NP       And   diphenhydrAMINE  (BENADRYL ) injection 50 mg  50 mg Intramuscular TID PRN Thresa Alan BROCKS, NP       And   LORazepam  (ATIVAN ) injection 2 mg  2 mg Intramuscular TID PRN Brent, Amanda C, NP       haloperidol  lactate (HALDOL ) injection 10 mg  10 mg  Intramuscular TID PRN Brent, Amanda C, NP       And   diphenhydrAMINE  (BENADRYL ) injection 50 mg  50 mg Intramuscular TID PRN Brent, Amanda C, NP       And   LORazepam  (ATIVAN ) injection 2 mg  2 mg Intramuscular TID PRN Brent, Amanda C, NP       DULoxetine  (CYMBALTA ) DR capsule 30 mg  30 mg Oral Daily Nasrin Lanzo, MD   30 mg at  06/24/24 0930   hydrOXYzine  (ATARAX ) tablet 25 mg  25 mg Oral Q6H PRN Brent, Amanda C, NP   25 mg at 06/23/24 2110   loperamide  (IMODIUM ) capsule 2-4 mg  2-4 mg Oral PRN Thresa Alan BROCKS, NP       LORazepam  (ATIVAN ) tablet 1 mg  1 mg Oral Q6H PRN Thresa Alan BROCKS, NP   1 mg at 06/23/24 1350   magnesium  hydroxide (MILK OF MAGNESIA) suspension 30 mL  30 mL Oral Daily PRN Brent, Amanda C, NP       multivitamin with minerals tablet 1 tablet  1 tablet Oral Daily Thresa Alan C, NP   1 tablet at 06/24/24 0930   nicotine  (NICODERM CQ  - dosed in mg/24 hours) patch 21 mg  21 mg Transdermal Q0600 Thresa Alan C, NP   21 mg at 06/23/24 1134   ondansetron  (ZOFRAN -ODT) disintegrating tablet 4 mg  4 mg Oral Q6H PRN Thresa Alan BROCKS, NP   4 mg at 06/24/24 1044   thiamine  (Vitamin B-1) tablet 100 mg  100 mg Oral Daily Thresa Alan C, NP   100 mg at 06/24/24 0930   traZODone  (DESYREL ) tablet 50 mg  50 mg Oral QHS PRN Thresa Alan BROCKS, NP   50 mg at 06/23/24 2110    Lab Results:  Results for orders placed or performed during the hospital encounter of 06/22/24 (from the past 48 hours)  CBC with Differential/Platelet     Status: None   Collection Time: 06/22/24  1:19 PM  Result Value Ref Range   WBC 9.4 4.0 - 10.5 K/uL   RBC 4.22 3.87 - 5.11 MIL/uL   Hemoglobin 13.5 12.0 - 15.0 g/dL   HCT 59.6 63.9 - 53.9 %   MCV 95.5 80.0 - 100.0 fL   MCH 32.0 26.0 - 34.0 pg   MCHC 33.5 30.0 - 36.0 g/dL   RDW 87.3 88.4 - 84.4 %   Platelets 276 150 - 400 K/uL   nRBC 0.0 0.0 - 0.2 %   Neutrophils Relative % 80 %   Neutro Abs 7.5 1.7 - 7.7 K/uL   Lymphocytes Relative 12 %   Lymphs Abs 1.1 0.7 - 4.0 K/uL   Monocytes Relative 7 %   Monocytes Absolute 0.7 0.1 - 1.0 K/uL   Eosinophils Relative 1 %   Eosinophils Absolute 0.1 0.0 - 0.5 K/uL   Basophils Relative 0 %   Basophils Absolute 0.0 0.0 - 0.1 K/uL   Immature Granulocytes 0 %   Abs Immature Granulocytes 0.04 0.00 - 0.07 K/uL    Comment: Performed at [facility], [address] [place], [place] [address]  Comprehensive metabolic panel     Status: Abnormal   Collection Time: 06/22/24  1:19 PM  Result Value Ref Range   Sodium 139 135 - 145 mmol/L   Potassium 3.8 3.5 - 5.1 mmol/L   Chloride 98 98 - 111 mmol/L   CO2 28 22 - 32 mmol/L   Glucose, Bld 102 (H) 70 -  99 mg/dL    Comment: Glucose reference range applies only to samples taken after fasting for at least 8 hours.   BUN 6 6 - 20 mg/dL   Creatinine, Ser 9.18 0.44 - 1.00 mg/dL   Calcium 9.5 8.9 - 89.6 mg/dL   Total Protein 6.5 6.5 - 8.1 g/dL   Albumin 3.8 3.5 - 5.0 g/dL   AST 34 15 - 41 U/L   ALT 32 0 - 44 U/L   Alkaline Phosphatase 37 (L) 38 - 126 U/L   Total Bilirubin 0.8 0.0 - 1.2 mg/dL   GFR, Estimated >39 >39 mL/min    Comment: (NOTE) Calculated using the CKD-EPI Creatinine Equation (2021)    Anion gap 13 5 - 15    Comment: Performed at [facility], [address], [place], [place] [address]  Hemoglobin A1c     Status: Abnormal   Collection Time: 06/22/24  1:19 PM  Result Value Ref Range   Hgb A1c MFr Bld 4.7 (L) 4.8 - 5.6 %    Comment: (NOTE)         Prediabetes: 5.7 - 6.4         Diabetes: >6.4         Glycemic control for adults with diabetes: <7.0    Mean Plasma Glucose 88 mg/dL    Comment: (NOTE) Performed At: [facility] [address] [place], [place] [id number] Jennette Shorter MD Ey:[id number]   Magnesium      Status: None   Collection Time: 06/22/24  1:19 PM  Result Value Ref Range   Magnesium  1.9 1.7 - 2.4 mg/dL    Comment: Performed at [facility], [address], [place], [place] [address]  Ethanol     Status: None   Collection Time: 06/22/24  1:19 PM  Result Value Ref Range   Alcohol, Ethyl (B) <15 <15 mg/dL    Comment: (NOTE) For medical purposes only. Performed at [facility], [address] [place], [place] [address]   Lipid panel     Status: None   Collection Time: 06/22/24  1:19 PM  Result Value Ref Range    Cholesterol 179 0 - 200 mg/dL   Triglycerides 97 <849 mg/dL   HDL 892 >59 mg/dL   Total CHOL/HDL Ratio 1.7 RATIO   VLDL 19 0 - 40 mg/dL   LDL Cholesterol 53 0 - 99 mg/dL    Comment:        Total Cholesterol/HDL:CHD Risk Coronary Heart Disease Risk Table                     Men   Women  1/2 Average Risk   3.4   3.3  Average Risk       5.0   4.4  2 X Average Risk   9.6   7.1  3 X Average Risk  23.4   11.0        Use the calculated Patient Ratio above and the CHD Risk Table to determine the patient's CHD Risk.        ATP III CLASSIFICATION (LDL):  <100     mg/dL   Optimal  899-870  mg/dL   Near or Above                    Optimal  130-159  mg/dL   Borderline  839-810  mg/dL   High  >809     mg/dL   Very High Performed  at [facility], [address], [place], [place] [address]   TSH     Status: None   Collection Time: 06/22/24  1:19 PM  Result Value Ref Range   TSH 1.326 0.350 - 4.500 uIU/mL    Comment: Performed by a 3rd Generation assay with a functional sensitivity of <=0.01 uIU/mL. Performed at [facility], [address], [place], [place] [address]   POC urine preg, ED     Status: Normal   Collection Time: 06/22/24  1:23 PM  Result Value Ref Range   Preg Test, Ur Negative Negative  POCT Urine Drug Screen - (I-Screen)     Status: Abnormal   Collection Time: 06/22/24  1:23 PM  Result Value Ref Range   POC Amphetamine UR None Detected NONE DETECTED (Cut Off Level 1000 ng/mL)   POC Secobarbital (BAR) None Detected NONE DETECTED (Cut Off Level 300 ng/mL)   POC Buprenorphine (BUP) None Detected NONE DETECTED (Cut Off Level 10 ng/mL)   POC Oxazepam (BZO) Positive (A) NONE DETECTED (Cut Off Level 300 ng/mL)   POC Cocaine UR None Detected NONE DETECTED (Cut Off Level 300 ng/mL)   POC Methamphetamine UR None Detected NONE DETECTED (Cut Off Level 1000 ng/mL)   POC Morphine None Detected NONE DETECTED (Cut Off Level 300 ng/mL)   POC Methadone UR None Detected NONE  DETECTED (Cut Off Level 300 ng/mL)   POC Oxycodone  UR None Detected NONE DETECTED (Cut Off Level 100 ng/mL)   POC Marijuana UR Positive (A) NONE DETECTED (Cut Off Level 50 ng/mL)  Urinalysis, Routine w reflex microscopic -Urine, Clean Catch     Status: Abnormal   Collection Time: 06/22/24  1:24 PM  Result Value Ref Range   Color, Urine AMBER (A) YELLOW    Comment: BIOCHEMICALS MAY BE AFFECTED BY COLOR   APPearance CLOUDY (A) CLEAR   Specific Gravity, Urine 1.017 1.005 - 1.030   pH 7.0 5.0 - 8.0   Glucose, UA NEGATIVE NEGATIVE mg/dL   Hgb urine dipstick NEGATIVE NEGATIVE   Bilirubin Urine NEGATIVE NEGATIVE   Ketones, ur 5 (A) NEGATIVE mg/dL   Protein, ur NEGATIVE NEGATIVE mg/dL   Nitrite POSITIVE (A) NEGATIVE   Leukocytes,Ua NEGATIVE NEGATIVE   RBC / HPF 0-5 0 - 5 RBC/hpf   WBC, UA 0-5 0 - 5 WBC/hpf   Bacteria, UA FEW (A) NONE SEEN   Squamous Epithelial / HPF 6-10 0 - 5 /HPF   Mucus PRESENT    Amorphous Crystal PRESENT     Comment: Performed at [facility], [address], [place], [place] [address]    Blood Alcohol level:  Lab Results  Component Value Date   [facility] <15 06/22/2024   ETH <10 06/18/2018    Metabolic Labs: Lab Results  Component Value Date   HGBA1C 4.7 (L) 06/22/2024   MPG 88 06/22/2024   No results found for: PROLACTIN Lab Results  Component Value Date   CHOL 179 06/22/2024   TRIG 97 06/22/2024   HDL 107 06/22/2024   CHOLHDL 1.7 06/22/2024   VLDL 19 06/22/2024   LDLCALC 53 06/22/2024    Physical Findings: AIMS: No  CIWA:  CIWA-Ar Total: 3   Psychiatric Specialty Exam: General Appearance: Casual   Eye Contact: Fair   Speech: Clear and Coherent   Volume: Normal   Mood: Depressed; Hopeless   Affect: Congruent; Depressed   Thought Content: WDL   Suicidal Thoughts: denies  Homicidal Thoughts: denies  Thought Process:  Coherent   Orientation: Full (Time, Place and Person)     Memory: Recent Fair; Immediate Good   Judgment:  Fair   Insight: Fair   Concentration: Fair   Recall: Fair   Fund of Knowledge: Fair   Language: Fair   Psychomotor Activity: wnl  Assets: Manufacturing systems engineer; Desire for Improvement; Financial Resources/Insurance; Housing; Physical Health; Resilience; Social Support; Vocational/Educational   Sleep: No data recorded   Review of Systems ROS  Vital Signs: Blood pressure 129/87, pulse 85, temperature 98.4 F (36.9 C), temperature source Oral, resp. rate 18, height 5' 3 (1.6 m), weight 70.3 kg, SpO2 100%. Body mass index is 27.46 kg/m. Physical Exam  I certify that inpatient services furnished can reasonably be expected to improve the patient's condition.   Signed: Prentice Espy, MD 06/24/2024, 12:59 PM

## 2024-06-24 NOTE — BHH Group Notes (Signed)
 Adult Psychoeducational Group Note  Date:  06/24/2024 Time:  9:15 PM  Group Topic/Focus:  Wrap-Up Group:   The focus of this group is to help patients review their daily goal of treatment and discuss progress on daily workbooks.  Participation Level:  Active  Participation Quality:  Appropriate  Affect:  Appropriate  Cognitive:  Appropriate  Insight: Appropriate  Engagement in Group:  Engaged  Modes of Intervention:  Discussion  Additional Comments:  Killian said her day was a 9. The positive part her day she lay hi grass today.  Lang Drilling Long 06/24/2024, 9:15 PM

## 2024-06-24 NOTE — BHH Group Notes (Signed)
 B[facility] Group Notes:  (Nursing/MHT/Case Management/Adjunct)  Date:  06/24/2024  Time:  9:15 AM  Type of Therapy:  Group Topic/ Focus: Goals Group: The focus of this group is to help patients establish daily goals to achieve during treatment and discuss how the patient can incorporate goal setting into their daily lives to aide in recovery.   Participation Level:  Did Not Attend  Summary of Progress/Problems:  Patient did not attend goals group today. Patient was encouraged but refused.   Danette R Namya Voges 06/24/2024, 9:15 AM

## 2024-06-25 ENCOUNTER — Encounter (HOSPITAL_COMMUNITY): Payer: Self-pay

## 2024-06-25 MED ORDER — SULFAMETHOXAZOLE-TRIMETHOPRIM 800-160 MG PO TABS
1.0000 | ORAL_TABLET | Freq: Two times a day (BID) | ORAL | Status: AC
  Administered 2024-06-25 – 2024-06-28 (×6): 1 via ORAL
  Filled 2024-06-25 (×6): qty 1

## 2024-06-25 NOTE — Progress Notes (Signed)
(  Sleep Hours) -9.25 (Any PRNs that were needed, meds refused, or side effects to meds)-prn hydroxyzine  and trazodone  @2116   (Any disturbances and when (visitation, over night)-none (Concerns raised by the patient)- none (SI/HI/AVH)- Denies all

## 2024-06-25 NOTE — Group Note (Signed)
 Recreation Therapy Group Note   Group Topic:Leisure Education  Group Date: 06/25/2024 Start Time: 0935 End Time: 1015 Facilitators: Hira Trent-McCall, LRT,CTRS Location: [address] Dayroom   Group Topic: Leisure Education   Goal Area(s) Addresses:  Patient will successfully identify positive leisure and recreation activities.  Patient will acknowledge benefits of participation in healthy leisure activities post discharge.  Patient will actively work with peers toward a shared goal.   Behavioral Response:    Intervention: Competitive Group Game   Activity: Guess the [employer]. Patients were to use the spinner to land on a category ([address] Hip Hop, Indie, Dance, R&B and Pop). Whatever category the patient landed on, LRT would read the lyric and patient had to guess the missing word from the lyric. It patient didn't guess the correct answer, everyone else got a chance to steal the point. Whoever guessed the correct answer, got the point. The person with the most cards at the end of the game was the winner.   Education:  Teacher, English as a foreign language, Stress Management, Discharge Planning  Education Outcome: Acknowledges education/In group clarification offered/Needs additional education   Affect/Mood: N/A   Participation Level: Did not attend    Clinical Observations/Individualized Feedback:      Plan: Continue to engage patient in RT group sessions 2-3x/week.   Julie-Anne Torain-McCall, LRT,CTRS 06/25/2024 12:05 PM

## 2024-06-25 NOTE — Progress Notes (Signed)
 [facility] MD Progress Note  06/25/2024 1:42 PM SOPHEAP BASIC  MRN:  [id number]  Reason for Admission: Gina Sparks is a 28 y.o., female with history of stimulant use disorder-cocaine, alcohol use disorder, major depressive disorder, PTSD, ADHD, substance-induced bipolar disorder presents to behavioral health hospital requesting assistance to address alcohol dependence.   24-hour chart review: Chart reviewed. Patient's case discussed in interdisciplinary team meeting.  Vital signs reviewed without critical value.  CIWA-Ar total  4.  As needed hydroxyzine  and trazodone  required overnight.  Subsequently, she slept a documented 9.25 hours.  She is adherent to taking psychotropic medication regimen. Nursing notes indicate no disturbances or concerns.    Daily Evaluation: On assessment today, Gina Sparks  reports feeling good and states she was able to sleep in, reporting, I am not a 6 AM person.  She reports being cranky yesterday but eventually had a good day.  She rates her anxiety and depression as 2/10, identifying triggers as seeing others in relationships and having children, which she states made her sad.  Also reports feeling that everybody is mad at me or wants me dead.  She reports a history of severe PTSD and states, my own mom left me.  She reports adequate sleep last night states that hydroxyzine  and trazodone  helped.  She reports a longstanding history of insomnia and racing thoughts, stating that over the past year she has been awake until 3 AM.  She reports limited appetite, stating she has not eaten breakfast regularly since sixth grade and eats small meals throughout the day.  Energy is reported as good today; states she slept until 9:55 AM.  Concentration is reported as really bad.  She denies suicidal ideation including passive thoughts or self-harm urges.  She denies homicidal ideation.  She denies psychotic symptoms.  She denies side effects from current psychiatric medications.  She  states that alcohol withdrawal symptoms are mild, with minimal shaking, occasional vomiting this morning, and no significant cravings.  She reports continuing interest in IOP.  She shares that she wrote an apology and forgiveness letter to her mother.  She identifies yelling as a trigger for drinking, recalling that her mother frequently yelled during her childhood.  She states her plan is to move in with her father and stepmother after discharge.  Gina Sparks shows motivation for recovery, including continued interest in IOP. Mild bilateral upper extremity tremors are noted.  She experienced vomiting this morning and reported nausea, which was treated with Zofran  by nursing.  Nursing further reports patient inquired about urinalysis results.  No overt psychotic or manic features are observed.  Appears to be tolerating Cymbalta  well.  Continue current psychiatric treatment regimen.  Urinalysis shows amber, cloudy urine, positive nitrates, and a few bacteria.  Will treat UTI with Bactrim  DS 800-160 mg every 12 hours for 3 days.  Principal Problem: Alcohol use disorder, severe, dependence (HCC) Diagnosis: Principal Problem:   Alcohol use disorder, severe, dependence (HCC) Active Problems:   Generalized anxiety disorder   Major depressive disorder, recurrent episode, severe (HCC)   Severe stimulant use disorder (HCC)   PTSD (post-traumatic stress disorder)  Total Time spent with patient: 45 minutes  Past Psychiatric History: See H&P  Past Medical History:  Past Medical History:  Diagnosis Date   ADHD    Anxiety and depression    History reviewed. No pertinent surgical history. Family History:  Family History  Problem Relation Age of Onset   Anxiety disorder Father    Depression Father  Anxiety disorder Maternal Grandmother    Depression Maternal Grandmother    Dementia Maternal Grandfather    Breast cancer Paternal Grandmother 33   Dementia Paternal Grandfather    Family Psychiatric   History: As listed above Social History:  Social History   Substance and Sexual Activity  Alcohol Use Yes   Comment: CC:  [facility]     Social History   Substance and Sexual Activity  Drug Use Yes   Types: Marijuana   Comment: CC: [facility] (hx of drugs)    Social History   Socioeconomic History   Marital status: Single    Spouse name: Not on file   Number of children: Not on file   Years of education: Not on file   Highest education level: Not on file  Occupational History   Not on file  Tobacco Use   Smoking status: Some Days    Types: E-cigarettes   Smokeless tobacco: Never  Substance and Sexual Activity   Alcohol use: Yes    Comment: CC:  [facility]   Drug use: Yes    Types: Marijuana    Comment: CC: [facility] (hx of drugs)   Sexual activity: Yes    Birth control/protection: I.U.D.  Other Topics Concern   Not on file  Social History Narrative   Not on file   Social Drivers of Health   Financial Resource Strain: Not on file  Food Insecurity: Food Insecurity Present (06/22/2024)   Hunger Vital Sign    Worried About Running Out of Food in the Last Year: Sometimes true    Ran Out of Food in the Last Year: Sometimes true  Transportation Needs: No Transportation Needs (06/22/2024)   PRAPARE - Administrator, Civil Service (Medical): No    Lack of Transportation (Non-Medical): No  Physical Activity: Unknown (08/06/2019)   Exercise Vital Sign    Days of Exercise per Week: 5 days    Minutes of Exercise per Session: Not on file  Stress: Not on file  Social Connections: Unknown (08/03/2023)   [facility]   Social Network    Social Network: Not on file   Additional Social History:                          Current Medications: Current Facility-Administered Medications  Medication Dose Route Frequency Provider Last Rate Last Admin   acetaminophen  (TYLENOL ) tablet 650 mg  650 mg Oral Q6H PRN Brent, Amanda C, NP   650 mg at 06/25/24 1244   alum &  mag hydroxide-simeth (MAALOX/MYLANTA) 200-200-20 MG/5ML suspension 30 mL  30 mL Oral Q4H PRN Brent, Amanda C, NP       clobetasol  ointment (TEMOVATE ) 0.05 % 1 Application  1 Application Topical BID PRN Lynnette Barter, MD   1 Application at 06/23/24 2109   haloperidol  (HALDOL ) tablet 5 mg  5 mg Oral TID PRN Brent, Amanda C, NP       And   diphenhydrAMINE  (BENADRYL ) capsule 50 mg  50 mg Oral TID PRN Brent, Amanda C, NP       haloperidol  lactate (HALDOL ) injection 5 mg  5 mg Intramuscular TID PRN Thresa Alan BROCKS, NP       And   diphenhydrAMINE  (BENADRYL ) injection 50 mg  50 mg Intramuscular TID PRN Brent, Amanda C, NP       And   LORazepam  (ATIVAN ) injection 2 mg  2 mg Intramuscular TID PRN Brent, Amanda C, NP  haloperidol  lactate (HALDOL ) injection 10 mg  10 mg Intramuscular TID PRN Brent, Amanda C, NP       And   diphenhydrAMINE  (BENADRYL ) injection 50 mg  50 mg Intramuscular TID PRN Brent, Amanda C, NP       And   LORazepam  (ATIVAN ) injection 2 mg  2 mg Intramuscular TID PRN Brent, Amanda C, NP       DULoxetine  (CYMBALTA ) DR capsule 30 mg  30 mg Oral Daily Ji, Andrew, MD   30 mg at 06/25/24 9046   hydrOXYzine  (ATARAX ) tablet 25 mg  25 mg Oral Q6H PRN Brent, Amanda C, NP   25 mg at 06/25/24 9045   loperamide  (IMODIUM ) capsule 2-4 mg  2-4 mg Oral PRN Brent, Amanda C, NP       LORazepam  (ATIVAN ) tablet 1 mg  1 mg Oral Q6H PRN Brent, Amanda C, NP   1 mg at 06/24/24 1308   magnesium  hydroxide (MILK OF MAGNESIA) suspension 30 mL  30 mL Oral Daily PRN Brent, Amanda C, NP       multivitamin with minerals tablet 1 tablet  1 tablet Oral Daily Brent, Amanda C, NP   1 tablet at 06/25/24 9047   nicotine  (NICODERM CQ  - dosed in mg/24 hours) patch 21 mg  21 mg Transdermal Q0600 Brent, Amanda C, NP   21 mg at 06/25/24 0954   ondansetron  (ZOFRAN -ODT) disintegrating tablet 4 mg  4 mg Oral Q6H PRN Brent, Amanda C, NP   4 mg at 06/25/24 0954   thiamine  (Vitamin B-1) tablet 100 mg  100 mg Oral Daily Brent, Amanda  C, NP   100 mg at 06/25/24 9046   traZODone  (DESYREL ) tablet 50 mg  50 mg Oral QHS PRN Brent, Amanda C, NP   50 mg at 06/24/24 2115    Lab Results: No results found for this or any previous visit (from the past 48 hours).  Blood Alcohol level:  Lab Results  Component Value Date   [facility] <15 06/22/2024   ETH <10 06/18/2018    Metabolic Disorder Labs: Lab Results  Component Value Date   HGBA1C 4.7 (L) 06/22/2024   MPG 88 06/22/2024   No results found for: PROLACTIN Lab Results  Component Value Date   CHOL 179 06/22/2024   TRIG 97 06/22/2024   HDL 107 06/22/2024   CHOLHDL 1.7 06/22/2024   VLDL 19 06/22/2024   LDLCALC 53 06/22/2024    Physical Findings: AIMS:  ,  ,   N/A,  ,  ,  ,   CIWA:  CIWA-Ar Total: 4 COWS:   N/A  Musculoskeletal: Strength & Muscle Tone: within normal limits Gait & Station: normal Patient leans: N/A  Psychiatric Specialty Exam:  Presentation  General Appearance:  Casual  Eye Contact: Good  Speech: Clear and Coherent  Speech Volume: Normal  Handedness: Right   Mood and Affect  Mood: Euthymic (Good today.)  Affect: Congruent   Thought Process  Thought Processes: Coherent; Goal Directed; Linear  Descriptions of Associations:Intact  Orientation:Full (Time, Place and Person)  Thought Content:Logical  History of Schizophrenia/Schizoaffective disorder:No  Duration of Psychotic Symptoms:No data recorded Hallucinations:Hallucinations: None  Ideas of Reference:None  Suicidal Thoughts:Suicidal Thoughts: No  Homicidal Thoughts:Homicidal Thoughts: No   Sensorium  Memory: Immediate Good; Recent Good  Judgment: Fair  Insight: Fair   Art therapist  Concentration: Fair  Attention Span: Fair  Recall: Fair  Fund of Knowledge: Fair  Language: Fair   Psychomotor Activity  Psychomotor Activity:Psychomotor Activity: Restlessness (  Mild tremor)   Assets  Assets: Communication Skills; Desire for  Improvement; Financial Resources/Insurance; Physical Health; Resilience   Sleep  Sleep:Sleep: Fair    Physical Exam: Physical Exam Vitals and nursing note reviewed.  Constitutional:      General: She is not in acute distress.    Appearance: Normal appearance. She is not ill-appearing.  HENT:     Mouth/Throat:     Pharynx: Oropharynx is clear.  Cardiovascular:     Rate and Rhythm: Normal rate.     Pulses: Normal pulses.  Pulmonary:     Effort: No respiratory distress.  Skin:    General: Skin is dry.  Neurological:     Mental Status: She is alert and oriented to person, place, and time.    Review of Systems  Constitutional: Negative.   Eyes: Negative.   Respiratory: Negative.    Cardiovascular: Negative.   Gastrointestinal: Negative.   Genitourinary: Negative.   Musculoskeletal: Negative.   Skin: Negative.   Neurological:  Positive for tremors. Tingling: Mild. Psychiatric/Behavioral:  Positive for depression and substance abuse. Negative for hallucinations, memory loss and suicidal ideas. The patient is nervous/anxious and has insomnia.    Blood pressure 124/81, pulse 85, temperature 97.8 F (36.6 C), temperature source Oral, resp. rate 18, height 5' 3 (1.6 m), weight 70.3 kg, SpO2 100%. Body mass index is 27.46 kg/m.   Treatment Plan Summary: Daily contact with patient to assess and evaluate symptoms and progress in treatment and Medication management  ASSESSMENT: Patient has been tolerating Cymbalta  well since initiation without major somatic complaints.  Alcohol withdrawal symptoms minimal at this time.  Will continue to monitor for a few days while ensuring she has an appropriate disposition to address continued support for alcohol use disorder.    PLAN: Safety and Monitoring:  --  Voluntary admission to inpatient psychiatric unit for safety, stabilization and treatment  -- Daily contact with patient to assess and evaluate symptoms and progress in  treatment  -- Patient's case to be discussed in multi-disciplinary team meeting  -- Observation Level : q15 minute checks  -- Vital signs:  q12 hours  -- Precautions: suicide, elopement, and assault  2. Psychiatric Diagnoses and Treatment:  # Alcohol use disorder # Cocaine use disorder # Substance-induced mood disorder # Posttraumatic stress disorder  - Cymbalta  30 mg daily for PTSD  - Hydroxyzine  25 mg 3 times daily as needed, anxiety - Trazodone  50 mg at bedtime as needed, sleep - BH Haldol  agitation protocol (see MAR)  --  The risks/benefits/side-effects/alternatives to this medication were discussed in detail with the patient and time was given for questions. The patient consents to medication trial.  -- FDA  -- Metabolic profile and EKG monitoring obtained while on an atypical antipsychotic (BMI: Lipid Panel: HbgA1c: QTc:)   -- Encouraged patient to participate in unit milieu and in scheduled group therapies   -- Short Term Goals: Ability to identify changes in lifestyle to reduce recurrence of condition will improve  -- Long Term Goals: Improvement in symptoms so as ready for discharge    3. Medical Issues Being Addressed:  # Alcohol use disorder - CIWA with Ativan  as needed (last CIWA 4 - Multivitamin with minerals 1 tablet daily, Thiamine  100 mg daily - Imodium  2 to 4 mg as needed diarrhea or loose stools - Zofran  disintegrating tablet 4 mg every 6 hours as needed nausea, vomiting  # Cocaine use disorder - Cessation encouraged  # Nicotine  dependence - Nicotine  patch 21 mg/24  hours ordered - Cessation encouraged # UTI - Bactrim  DS 800-160 mg every 12 hours for 3 days.  4. Discharge Planning:   -- Social work and case management to assist with discharge planning and identification of hospital follow-up needs prior to discharge  -- Estimated LOS: 5-7 days  -- Discharge Concerns: Need to establish a safety plan; Medication compliance and effectiveness  -- Discharge  Goals: Return home with outpatient referrals for mental health follow-up including medication management/psychotherapy     I personally spent a total of 45 minutes in the care of the patient today including preparing to see the patient, getting/reviewing separately obtained history, counseling and educating, referring and communicating with other health care professionals, documenting clinical information in the EHR, and coordinating care.   I certify that inpatient services furnished can reasonably be expected to improve the patient's condition.   Blair Chiquita Hint, NP 06/25/2024, 1:42 PM

## 2024-06-25 NOTE — Plan of Care (Signed)
  Problem: Education: Goal: Emotional status will improve Outcome: Progressing   Problem: Activity: Goal: Interest or engagement in activities will improve Outcome: Progressing   Problem: Health Behavior/Discharge Planning: Goal: Compliance with treatment plan for underlying cause of condition will improve Outcome: Progressing   Problem: Safety: Goal: Periods of time without injury will increase Outcome: Progressing

## 2024-06-25 NOTE — BH IP Treatment Plan (Signed)
 Interdisciplinary Treatment and Diagnostic Plan Update  06/25/2024 Time of Session: 10:50AM Gina Sparks MRN: [id number]  Principal Diagnosis: Alcohol use disorder, severe, dependence (HCC)  Secondary Diagnoses: Principal Problem:   Alcohol use disorder, severe, dependence (HCC) Active Problems:   Generalized anxiety disorder   Major depressive disorder, recurrent episode, severe (HCC)   Severe stimulant use disorder (HCC)   PTSD (post-traumatic stress disorder)   Current Medications:  Current Facility-Administered Medications  Medication Dose Route Frequency Provider Last Rate Last Admin   acetaminophen  (TYLENOL ) tablet 650 mg  650 mg Oral Q6H PRN Brent, Amanda C, NP   650 mg at 06/25/24 1244   alum & mag hydroxide-simeth (MAALOX/MYLANTA) 200-200-20 MG/5ML suspension 30 mL  30 mL Oral Q4H PRN Brent, Amanda C, NP       clobetasol  ointment (TEMOVATE ) 0.05 % 1 Application  1 Application Topical BID PRN Lynnette Barter, MD   1 Application at 06/23/24 2109   haloperidol  (HALDOL ) tablet 5 mg  5 mg Oral TID PRN Brent, Amanda C, NP       And   diphenhydrAMINE  (BENADRYL ) capsule 50 mg  50 mg Oral TID PRN Brent, Amanda C, NP       haloperidol  lactate (HALDOL ) injection 5 mg  5 mg Intramuscular TID PRN Brent, Amanda C, NP       And   diphenhydrAMINE  (BENADRYL ) injection 50 mg  50 mg Intramuscular TID PRN Thresa Alan BROCKS, NP       And   LORazepam  (ATIVAN ) injection 2 mg  2 mg Intramuscular TID PRN Brent, Amanda C, NP       haloperidol  lactate (HALDOL ) injection 10 mg  10 mg Intramuscular TID PRN Thresa Alan BROCKS, NP       And   diphenhydrAMINE  (BENADRYL ) injection 50 mg  50 mg Intramuscular TID PRN Brent, Amanda C, NP       And   LORazepam  (ATIVAN ) injection 2 mg  2 mg Intramuscular TID PRN Brent, Amanda C, NP       DULoxetine  (CYMBALTA ) DR capsule 30 mg  30 mg Oral Daily Ji, Andrew, MD   30 mg at 06/25/24 9046   hydrOXYzine  (ATARAX ) tablet 25 mg  25 mg Oral Q6H PRN Brent, Amanda C, NP   25 mg at  06/25/24 9045   loperamide  (IMODIUM ) capsule 2-4 mg  2-4 mg Oral PRN Brent, Amanda C, NP       LORazepam  (ATIVAN ) tablet 1 mg  1 mg Oral Q6H PRN Brent, Amanda C, NP   1 mg at 06/24/24 1308   magnesium  hydroxide (MILK OF MAGNESIA) suspension 30 mL  30 mL Oral Daily PRN Brent, Amanda C, NP       multivitamin with minerals tablet 1 tablet  1 tablet Oral Daily Brent, Amanda C, NP   1 tablet at 06/25/24 9047   nicotine  (NICODERM CQ  - dosed in mg/24 hours) patch 21 mg  21 mg Transdermal Q0600 Brent, Amanda C, NP   21 mg at 06/25/24 0954   ondansetron  (ZOFRAN -ODT) disintegrating tablet 4 mg  4 mg Oral Q6H PRN Brent, Amanda C, NP   4 mg at 06/25/24 0954   thiamine  (Vitamin B-1) tablet 100 mg  100 mg Oral Daily Brent, Amanda C, NP   100 mg at 06/25/24 9046   traZODone  (DESYREL ) tablet 50 mg  50 mg Oral QHS PRN Brent, Amanda C, NP   50 mg at 06/24/24 2115   PTA Medications: Medications Prior to Admission  Medication Sig Dispense  Refill Last Dose/Taking   acetaminophen  (TYLENOL ) 500 MG tablet Take 1,000 mg by mouth every 6 (six) hours as needed for headache.      clobetasol  ointment (TEMOVATE ) 0.05 % Apply 1 Application topically 2 (two) times daily. 60 g 6    Dupilumab  (DUPIXENT ) 300 MG/2ML SOAJ Inject 300 mg into the skin every 14 (fourteen) days. Inject one 300mg /87mL pen SQ Q2W. 4 mL 5    ibuprofen  (ADVIL ) 200 MG tablet Take 400 mg by mouth every 6 (six) hours as needed for headache.       Patient Stressors: Financial difficulties   Loss of housing    Patient Strengths: Active sense of humor  Communication skills  Motivation for treatment/growth   Treatment Modalities: Medication Management, Group therapy, Case management,  1 to 1 session with clinician, Psychoeducation, Recreational therapy.   Physician Treatment Plan for Primary Diagnosis: Alcohol use disorder, severe, dependence (HCC) Long Term Goal(s):     Short Term Goals:    Medication Management: Evaluate patient's response, side  effects, and tolerance of medication regimen.  Therapeutic Interventions: 1 to 1 sessions, Unit Group sessions and Medication administration.  Evaluation of Outcomes: Not Progressing  Physician Treatment Plan for Secondary Diagnosis: Principal Problem:   Alcohol use disorder, severe, dependence (HCC) Active Problems:   Generalized anxiety disorder   Major depressive disorder, recurrent episode, severe (HCC)   Severe stimulant use disorder (HCC)   PTSD (post-traumatic stress disorder)  Long Term Goal(s):     Short Term Goals:       Medication Management: Evaluate patient's response, side effects, and tolerance of medication regimen.  Therapeutic Interventions: 1 to 1 sessions, Unit Group sessions and Medication administration.  Evaluation of Outcomes: Not Progressing   RN Treatment Plan for Primary Diagnosis: Alcohol use disorder, severe, dependence (HCC) Long Term Goal(s): Knowledge of disease and therapeutic regimen to maintain health will improve  Short Term Goals: Ability to remain free from injury will improve, Ability to verbalize frustration and anger appropriately will improve, Ability to demonstrate self-control, Ability to participate in decision making will improve, Ability to verbalize feelings will improve, Ability to disclose and discuss suicidal ideas, and Ability to identify and develop effective coping behaviors will improve  Medication Management: RN will administer medications as ordered by provider, will assess and evaluate patient's response and provide education to patient for prescribed medication. RN will report any adverse and/or side effects to prescribing provider.  Therapeutic Interventions: 1 on 1 counseling sessions, Psychoeducation, Medication administration, Evaluate responses to treatment, Monitor vital signs and CBGs as ordered, Perform/monitor CIWA, COWS, AIMS and Fall Risk screenings as ordered, Perform wound care treatments as ordered.  Evaluation  of Outcomes: Not Progressing   LCSW Treatment Plan for Primary Diagnosis: Alcohol use disorder, severe, dependence (HCC) Long Term Goal(s): Safe transition to appropriate next level of care at discharge, Engage patient in therapeutic group addressing interpersonal concerns.  Short Term Goals: Engage patient in aftercare planning with referrals and resources, Increase social support, Increase ability to appropriately verbalize feelings, Increase emotional regulation, Facilitate acceptance of mental health diagnosis and concerns, Facilitate patient progression through stages of change regarding substance use diagnoses and concerns, and Identify triggers associated with mental health/substance abuse issues  Therapeutic Interventions: Assess for all discharge needs, 1 to 1 time with Social worker, Explore available resources and support systems, Assess for adequacy in community support network, Educate family and significant other(s) on suicide prevention, Complete Psychosocial Assessment, Interpersonal group therapy.  Evaluation of Outcomes: Not Progressing  Progress in Treatment: Attending groups: Yes. Some groups Participating in groups: Yes. Taking medication as prescribed: Yes. Toleration medication: Yes. Family/Significant other contact made: Yes, individual(s) contacted:  Marquis Diles (dad)  [phone]           Patient understands diagnosis: Yes. Discussing patient identified problems/goals with staff: Yes. Medical problems stabilized or resolved: Yes. Denies suicidal/homicidal ideation: Yes. Issues/concerns per patient self-inventory: No.  Patient Goals:  to fully withdraw from alcohol  Discharge Plan or Barriers: Patient will likely discharge to home with family once stable.   Reason for Continuation of Hospitalization: Medication stabilization Withdrawal symptoms  Estimated Length of Stay: 5-7 days  Last 3 [place] Suicide Severity Risk Score: Flowsheet Row Admission  ([facility] Most recent reading at 06/22/2024  9:45 PM ED from 06/22/2024 in [facility] Most recent reading at 06/22/2024  3:12 PM ED from 02/20/2024 in [facility] Emergency Department at [facility] Most recent reading at 02/20/2024  2:14 PM  C-SSRS RISK CATEGORY Moderate Risk No Risk No Risk    Last PHQ 2/9 Scores:     No data to display          Scribe for Treatment Team: Joban Colledge M Shaquile Lutze, [place] 06/25/2024 1:22 PM

## 2024-06-25 NOTE — Group Note (Signed)
 Date:  06/25/2024 Time:  4:23 PM  Group Topic/Focus: Transforming Your Thinking Managing Feelings:   The focus of this group is to identify what feelings patients have difficulty handling and develop a plan to handle them in a healthier way upon discharge.    Participation Level:  Active  Participation Quality:  Appropriate and Sharing  Affect:  Appropriate  Cognitive:  Alert  Insight: Good  Engagement in Group:  Distracting  Modes of Intervention:  Discussion and Socialization  Additional Comments:  Gina Sparks participated in group but required some redirection to focus back on the topic.   Gina Sparks 06/25/2024, 4:23 PM

## 2024-06-25 NOTE — [facility] Group Notes (Signed)
 [facility] Group Notes:  (Nursing/MHT/Case Management/Adjunct)  Date:  06/25/2024  Time:  8:20 PM  Type of Therapy:  AA Group  Participation Level:  Active  Participation Quality:  Appropriate  Affect:  Appropriate  Cognitive:  Appropriate  Insight:  Appropriate  Engagement in Group:  Engaged  Modes of Intervention:  Education  Summary of Progress/Problems:Attended AA meeting.  Grayce LITTIE Essex 06/25/2024, 8:20 PM

## 2024-06-25 NOTE — Group Note (Deleted)
 Date:  06/25/2024 Time:  4:22 PM  Group Topic/Focus: Transforming Your Thinking Managing Feelings:   The focus of this group is to identify what feelings patients have difficulty handling and develop a plan to handle them in a healthier way upon discharge.     Participation Level:  {BHH PARTICIPATION OZCZO:77735}  Participation Quality:  {BHH PARTICIPATION QUALITY:22265}  Affect:  {BHH AFFECT:22266}  Cognitive:  {BHH COGNITIVE:22267}  Insight: {BHH Insight2:20797}  Engagement in Group:  {BHH ENGAGEMENT IN HMNLE:77731}  Modes of Intervention:  {BHH MODES OF INTERVENTION:22269}  Additional Comments:  ***  Sharlene Mccluskey 06/25/2024, 4:22 PM

## 2024-06-25 NOTE — Plan of Care (Signed)

## 2024-06-25 NOTE — Group Note (Signed)
 Date:  06/25/2024 Time:  8:56 AM  Group Topic/Focus:  Goals Group:   The focus of this group is to help patients establish daily goals to achieve during treatment and discuss how the patient can incorporate goal setting into their daily lives to aide in recovery.    Participation Level:  Did Not Attend

## 2024-06-25 NOTE — Progress Notes (Signed)
   06/25/24 0900  Psych Admission Type (Psych Patients Only)  Admission Status Voluntary  Psychosocial Assessment  Patient Complaints Malaise  Eye Contact Fair  Facial Expression Animated  Affect Appropriate to circumstance  Speech Logical/coherent  Interaction Assertive  Motor Activity Other (Comment) (WDL)  Appearance/Hygiene Unremarkable  Behavior Characteristics Cooperative  Mood Pleasant  Thought Process  Coherency WDL  Content WDL  Delusions None reported or observed  Perception WDL  Hallucination None reported or observed  Judgment Poor  Confusion None  Danger to Self  Current suicidal ideation? Denies  Agreement Not to Harm Self Yes  Description of Agreement verbal  Danger to Others  Danger to Others None reported or observed

## 2024-06-25 NOTE — Progress Notes (Signed)
   06/25/24 2200  Psych Admission Type (Psych Patients Only)  Admission Status Voluntary  Psychosocial Assessment  Patient Complaints None  Eye Contact Fair  Facial Expression Animated  Affect Appropriate to circumstance  Speech Logical/coherent  Interaction Assertive  Motor Activity Other (Comment) (WDL)  Appearance/Hygiene Unremarkable  Behavior Characteristics Cooperative  Mood Pleasant  Aggressive Behavior  Effect No apparent injury  Thought Process  Coherency WDL  Content WDL  Delusions None reported or observed  Perception WDL  Hallucination None reported or observed  Judgment Impaired  Confusion None  Danger to Self  Current suicidal ideation? Denies

## 2024-06-26 MED ORDER — NICOTINE POLACRILEX 2 MG MT GUM
2.0000 mg | CHEWING_GUM | OROMUCOSAL | Status: DC | PRN
  Administered 2024-06-26: 2 mg via ORAL
  Filled 2024-06-26: qty 1

## 2024-06-26 MED ORDER — LORAZEPAM 1 MG PO TABS: 1.0000 mg | ORAL_TABLET | Freq: Once | ORAL | Status: DC

## 2024-06-26 MED ORDER — GABAPENTIN 100 MG PO CAPS
100.0000 mg | ORAL_CAPSULE | Freq: Three times a day (TID) | ORAL | Status: DC
  Administered 2024-06-26 – 2024-06-27 (×3): 100 mg via ORAL
  Filled 2024-06-26 (×5): qty 1

## 2024-06-26 NOTE — BHH Suicide Risk Assessment (Signed)
 Gina Sparks  Spoke with pt's step-mom, [name] [phone] and her father [name] per pt's request.   Discussed disposition planning, pt is not allowed to return home at discharge without completing a substance use program at least 2 months long. Discussed with parents CD IOP, they state this is not enough for pt to be able to return home.   Family would like a meeting prior to patient discharging. They would like pt to go to treatment directly from the hospital, they are aware that if pt declines treatment and has no other options of where to go at discharge, discharge will be to a shelter.   Will update treatment team this afternoon.  Signed:  Severiano Utsey, LCSW-A 06/26/2024  1:23 PM

## 2024-06-26 NOTE — Progress Notes (Signed)
 Tour of Duty:  Prentice JINNY Angle, RN, 06/26/24, Tour of Duty: 0700-1900  SI/HI/AVH: Denies  Self-Reported   Mood: Positive  Anxiety: Denies Depression: Denies Irritability: Denies  Broset  Violence Prevention Guidelines *See Row Information*: Moderate Violence Risk interventions implemented   LBM  Last BM Date : 06/25/24   Pain: not present  Patient Refusals (including Rx): No  Shift Summary: Patient observed to be calm on unit. Patient able to make needs known. Patient observed to engage appropriately with staff and peers. Patient taking medications as prescribed. This shift, no PRN medication requested or required. No observed or reported side effects to medication. No observed or reported agitation, aggression, or other acute emotional distress. No observed or reported physical abnormalities or concerns. Patient is bright and congruent. She reports happy that he said I love you first.  Update at 1600: Patient observed to be engaged in an active panic attack in milieu. Multiple episodes of emesis, dysregulation, rapid breathing, and tearfulness. Worked with patient on slowing her breathing and grounding herself. Walked patient back to her room to reduce stimulation. PO agitation protocol administered. Patient upset at parents request for a 60 day admission to rehab. Encouraged patient to focus on herself and not external factors. Patient able to return to baseline.  Last Vitals  Vitals Weight: 70.3 kg Temp: 97.8 F (36.6 C) Temp Source: Oral Pulse Rate: 83 Resp: 18 BP: 127/86 Patient Position: (not recorded)  Admission Type  Psych Admission Type (Psych Patients Only) Admission Status: Voluntary Date 72 hour document signed : (not recorded) Time 72 hour document signed : (not recorded) Provider Notified (First and Last Name) (see details for LINK to note): (not recorded)   Psychosocial Assessment  Psychosocial Assessment Patient Complaints: None Eye Contact:  Fair Facial Expression: Animated Affect: Appropriate to circumstance Speech: Logical/coherent Interaction: Assertive Motor Activity: Other (Comment) (WDL) Appearance/Hygiene: Unremarkable Behavior Characteristics: Cooperative Mood: Pleasant   Aggressive Behavior  Targets: (not recorded)   Thought Process  Thought Process Coherency: Within Defined Limits Content: Within Defined Limits Delusions: None reported or observed Perception: Within Defined Limits Hallucination: None reported or observed Judgment: Limited Confusion: None  Danger to Self/Others  Danger to Self Current suicidal ideation?: Denies Description of Suicide Plan: (not recorded) Self-Injurious Behavior: (not recorded) Agreement Not to Harm Self: (not recorded) Description of Agreement: (not recorded) Danger to Others: None reported or observed

## 2024-06-26 NOTE — Group Note (Signed)
 Recreation Therapy Group Note   Group Topic:Animal Assisted Therapy   Group Date: 06/26/2024 Start Time: 0945 End Time: 1030 Facilitators: Oneal Schoenberger-McCall, LRT,CTRS Location: [address] Dayroom   Animal-Assisted Activity (AAA) Program Checklist/Progress Notes Patient Eligibility Criteria Checklist & Daily Group note for Rec Tx Intervention  AAA/T Program Assumption of Risk Form signed by Patient/ or Parent Legal Guardian Yes  Patient is free of allergies or severe asthma Yes  Patient reports no fear of animals Yes  Patient reports no history of cruelty to animals Yes  Patient understands his/her participation is voluntary Yes  Patient washes hands before animal contact Yes  Patient washes hands after animal contact Yes  Behavioral Response: Engaged   Education: Charity fundraiser, Appropriate Animal Interaction   Education Outcome: Acknowledges education.    Affect/Mood: Appropriate   Participation Level: Engaged   Participation Quality: Independent   Behavior: Appropriate   Speech/Thought Process: Focused   Insight: Good   Judgement: Good   Modes of Intervention: Teaching laboratory technician   Patient Response to Interventions:  Engaged   Education Outcome:  In group clarification offered    Clinical Observations/Individualized Feedback: Patient attended session and interacted appropriately with therapy dog and peers. Patient asked appropriate questions about therapy dog and his training. Patient shared stories about their pets at home with group.     Plan: Continue to engage patient in RT group sessions 2-3x/week.   Rachel Samples-McCall, LRT,CTRS  06/26/2024 12:47 PM

## 2024-06-26 NOTE — Plan of Care (Signed)
   Problem: Education: Goal: Knowledge of [place] [facility] Education information/materials will improve Outcome: Progressing Goal: Emotional status will improve Outcome: Progressing Goal: Mental status will improve Outcome: Progressing

## 2024-06-26 NOTE — Group Note (Signed)
 Date:  06/26/2024 Time:  8:55 AM  Group Topic/Focus:  Goals Group:   The focus of this group is to help patients establish daily goals to achieve during treatment and discuss how the patient can incorporate goal setting into their daily lives to aide in recovery.    Participation Level:  Did Not Attend  Participation Quality:  na  Affect:  NA  Cognitive:  na  Insight: None  Engagement in Group:  na  Modes of Intervention:  na  Additional Comments:  na  Nat Rummer 06/26/2024, 8:55 AM

## 2024-06-26 NOTE — Group Note (Signed)
 Date:  06/26/2024 Time:  4:04 PM  Group Topic/Focus: Discuss methods to help fall asleep and wake up  Self Care: The focus of this group is to help patients understand the importance of self-care in order to improve or restore emotional, physical, spiritual, interpersonal, and financial health.    Participation Level:  Active  Participation Quality:  Appropriate  Affect:  Appropriate  Cognitive:  Appropriate  Insight: Appropriate  Engagement in Group:  Engaged  Modes of Intervention:  Discussion  Additional Comments:  Patient was appropriately engaged in group.  Kinzie Wickes D Shonique Pelphrey 06/26/2024, 4:04 PM

## 2024-06-26 NOTE — Group Note (Signed)
 LCSW Group Therapy Note   Group Date: 06/26/2024 Start Time: 1100 End Time: 1200   Participation:  did not attend  Type of Therapy:  Group Therapy  Topic:  Shining from Within:  Confidence and Self-Love Journey   Objective:  To support participants in developing confidence and self-love through self-awareness, self-compassion, and practical skills that nurture personal growth.   Group Goals Encourage self-reflection and self-acceptance by identifying personal strengths and achievements. Teach skills to challenge negative self-talk and replace it with supportive, truthful self-talk. Foster resilience and self-worth through [employer], gratitude, and self-care practices.   Summary:  This group explores the connection between confidence and self-love by guiding participants through reflection, mindset shifts, and practical tools like affirmations, strength recognition, and goal-setting. Activities are designed to promote self-compassion, build emotional resilience, and normalize the slow, patient journey of inner growth.   Therapeutic Modalities Used Cognitive Behavioral Therapy (CBT): Challenging and reframing unhelpful self-talk. Motivational Interviewing (MI): Encouraging small, achievable goals. Elements of Dialectical Behavioral Therapist (DBT):  Mindfulness and Self-Compassion: Promoting present-moment awareness and kindness toward self.   Gina Sparks, LCSWA 06/26/2024  5:26 PM

## 2024-06-26 NOTE — Progress Notes (Signed)
 Pt encourage to allow writer to take vitals after refusing. Writer explained vitals are used for CIWA assessment. Pt stated that it was to early for her to exhibit withdraw symptoms. Pt stated she would take her vitals later.

## 2024-06-26 NOTE — Plan of Care (Signed)
   Problem: Education: Goal: Emotional status will improve Outcome: Progressing Goal: Mental status will improve Outcome: Progressing

## 2024-06-26 NOTE — Progress Notes (Signed)
 [facility] MD Progress Note  06/26/2024 9:07 AM Gina Sparks  MRN:  [id number]  Reason for Admission: Gina Sparks is a 28 y.o., female with history of stimulant use disorder-cocaine, alcohol use disorder, major depressive disorder, PTSD, ADHD, substance-induced bipolar disorder presents to behavioral health hospital requesting assistance to address alcohol dependence.   24-hour chart review: Chart reviewed. Patient's case discussed in interdisciplinary team meeting.  Vital signs reviewed without critical value.  CIWA-Ar total  2.  As needed trazodone  required overnight. She slept a documented 9.25 hours.  She is adherent to taking psychotropic medication regimen. Nursing notes indicate patient refused CIWA vitals this a.m. stating it was too early for her to exhibit withdrawal symptoms, and that she would take her vitals later.   Daily Evaluation: On assessment today, Gina Sparks  reports her mood as better.  She explains her refusal of vitals this morning by stating, I am not a morning person.  She rates depression at 1-2/10 and anxiety at 5-6/10.  She identifies triggers as her current roommate leaving and a new roommate moving in.  She denies suicidal ideation, including passive thoughts or self-harm urges, as well as homicidal ideation and psychotic symptoms.  She reports minimal hand tremors and denies nausea or vomiting.  She states her medication is working, reports a prior poor tolerance of SSRIs made me feel like a zombie, and reports she has never been on an SNRI until currently.  She continues to express interest in CD-IOP treatment, requests a therapy referral specifically to Mliss Parsley at [facility].  She states interest in medication for alcohol cravings.   Nursing reports patient is pleasant, bright, and compliant with her medication regimen.  Social work reports patient cannot return home unless she attends a minimum 40-month inpatient treatment program, per her father and  stepmother.  Gina Sparks exhibits improved mood, minimal depressive symptoms, and moderate anxiety.  No manic features.  No overt psychotic symptoms.  Appears to be metabolizing alcohol without issues.  Continues to express interest in CD-IOP.  Will continue to motivate her to her recovery.  Case reviewed with attending psychiatrist, with plan to initiate gabapentin  100 mg 3 times daily for alcohol cravings.  Continue Bactrim  DS for UTI.  Appreciate social work support for referrals and therapy and CD-IOP.   Principal Problem: Alcohol use disorder, severe, dependence (HCC) Diagnosis: Principal Problem:   Alcohol use disorder, severe, dependence (HCC) Active Problems:   Generalized anxiety disorder   Major depressive disorder, recurrent episode, severe (HCC)   Severe stimulant use disorder (HCC)   PTSD (post-traumatic stress disorder)  Total Time spent with patient: 45 minutes  Past Psychiatric History: See H&P  Past Medical History:  Past Medical History:  Diagnosis Date   ADHD    Anxiety and depression    History reviewed. No pertinent surgical history. Family History:  Family History  Problem Relation Age of Onset   Anxiety disorder Father    Depression Father    Anxiety disorder Maternal Grandmother    Depression Maternal Grandmother    Dementia Maternal Grandfather    Breast cancer Paternal Grandmother 21   Dementia Paternal Grandfather    Family Psychiatric  History: As listed above Social History:  Social History   Substance and Sexual Activity  Alcohol Use Yes   Comment: CC:  [facility]     Social History   Substance and Sexual Activity  Drug Use Yes   Types: Marijuana   Comment: CC: [facility] (hx of drugs)  Social History   Socioeconomic History   Marital status: Single    Spouse name: Not on file   Number of children: Not on file   Years of education: Not on file   Highest education level: Not on file  Occupational History   Not on file  Tobacco Use   Smoking  status: Some Days    Types: E-cigarettes   Smokeless tobacco: Never  Substance and Sexual Activity   Alcohol use: Yes    Comment: CC:  [facility]   Drug use: Yes    Types: Marijuana    Comment: CC: [facility] (hx of drugs)   Sexual activity: Yes    Birth control/protection: I.U.D.  Other Topics Concern   Not on file  Social History Narrative   Not on file   Social Drivers of Health   Financial Resource Strain: Not on file  Food Insecurity: Food Insecurity Present (06/22/2024)   Hunger Vital Sign    Worried About Running Out of Food in the Last Year: Sometimes true    Ran Out of Food in the Last Year: Sometimes true  Transportation Needs: No Transportation Needs (06/22/2024)   PRAPARE - Administrator, Civil Service (Medical): No    Lack of Transportation (Non-Medical): No  Physical Activity: Unknown (08/06/2019)   Exercise Vital Sign    Days of Exercise per Week: 5 days    Minutes of Exercise per Session: Not on file  Stress: Not on file  Social Connections: Unknown (08/03/2023)   Received from [facility]   Social Network    Social Network: Not on file   Additional Social History:                          Current Medications: Current Facility-Administered Medications  Medication Dose Route Frequency Provider Last Rate Last Admin   acetaminophen  (TYLENOL ) tablet 650 mg  650 mg Oral Q6H PRN Brent, Amanda C, NP   650 mg at 06/25/24 1244   alum & mag hydroxide-simeth (MAALOX/MYLANTA) 200-200-20 MG/5ML suspension 30 mL  30 mL Oral Q4H PRN Brent, Amanda C, NP       clobetasol  ointment (TEMOVATE ) 0.05 % 1 Application  1 Application Topical BID PRN Lynnette Barter, MD   1 Application at 06/23/24 2109   haloperidol  (HALDOL ) tablet 5 mg  5 mg Oral TID PRN Brent, Amanda C, NP       And   diphenhydrAMINE  (BENADRYL ) capsule 50 mg  50 mg Oral TID PRN Brent, Amanda C, NP       haloperidol  lactate (HALDOL ) injection 5 mg  5 mg Intramuscular TID PRN Brent, Amanda C, NP       And    diphenhydrAMINE  (BENADRYL ) injection 50 mg  50 mg Intramuscular TID PRN Thresa Alan BROCKS, NP       And   LORazepam  (ATIVAN ) injection 2 mg  2 mg Intramuscular TID PRN Brent, Amanda C, NP       haloperidol  lactate (HALDOL ) injection 10 mg  10 mg Intramuscular TID PRN Thresa Alan BROCKS, NP       And   diphenhydrAMINE  (BENADRYL ) injection 50 mg  50 mg Intramuscular TID PRN Brent, Amanda C, NP       And   LORazepam  (ATIVAN ) injection 2 mg  2 mg Intramuscular TID PRN Brent, Amanda C, NP       DULoxetine  (CYMBALTA ) DR capsule 30 mg  30 mg Oral Daily Lynnette Barter, MD  30 mg at 06/25/24 9046   magnesium  hydroxide (MILK OF MAGNESIA) suspension 30 mL  30 mL Oral Daily PRN Brent, Amanda C, NP       multivitamin with minerals tablet 1 tablet  1 tablet Oral Daily Brent, Amanda C, NP   1 tablet at 06/25/24 9047   nicotine  (NICODERM CQ  - dosed in mg/24 hours) patch 21 mg  21 mg Transdermal Q0600 Brent, Amanda C, NP   21 mg at 06/25/24 9045   sulfamethoxazole -trimethoprim  (BACTRIM  DS) 800-160 MG per tablet 1 tablet  1 tablet Oral Q12H Iza Preston H, NP   1 tablet at 06/25/24 2149   thiamine  (Vitamin B-1) tablet 100 mg  100 mg Oral Daily Brent, Amanda C, NP   100 mg at 06/25/24 9046   traZODone  (DESYREL ) tablet 50 mg  50 mg Oral QHS PRN Brent, Amanda C, NP   50 mg at 06/25/24 2149    Lab Results: No results found for this or any previous visit (from the past 48 hours).  Blood Alcohol level:  Lab Results  Component Value Date   [facility] <15 06/22/2024   ETH <10 06/18/2018    Metabolic Disorder Labs: Lab Results  Component Value Date   HGBA1C 4.7 (L) 06/22/2024   MPG 88 06/22/2024   No results found for: PROLACTIN Lab Results  Component Value Date   CHOL 179 06/22/2024   TRIG 97 06/22/2024   HDL 107 06/22/2024   CHOLHDL 1.7 06/22/2024   VLDL 19 06/22/2024   LDLCALC 53 06/22/2024    Physical Findings: AIMS:  ,  ,   N/A,  ,  ,  ,   CIWA:  CIWA-Ar Total: 2 COWS:    N/A  Musculoskeletal: Strength & Muscle Tone: within normal limits Gait & Station: normal Patient leans: N/A  Psychiatric Specialty Exam:  Presentation  General Appearance:  Casual  Eye Contact: Good  Speech: Clear and Coherent  Speech Volume: Normal  Handedness: Right   Mood and Affect  Mood: Euthymic (Good today.)  Affect: Congruent   Thought Process  Thought Processes: Coherent; Goal Directed; Linear  Descriptions of Associations:Intact  Orientation:Full (Time, Place and Person)  Thought Content:Logical  History of Schizophrenia/Schizoaffective disorder:No  Duration of Psychotic Symptoms:No data recorded Hallucinations:Hallucinations: None  Ideas of Reference:None  Suicidal Thoughts:Suicidal Thoughts: No  Homicidal Thoughts:Homicidal Thoughts: No   Sensorium  Memory: Immediate Good; Recent Good  Judgment: Fair  Insight: Fair   Chartered certified accountant: Fair  Attention Span: Fair  Recall: [employer] of Knowledge: Fair  Language: Fair   Psychomotor Activity  Psychomotor Activity:Psychomotor Activity: Restlessness (Mild tremor)   Assets  Assets: Manufacturing systems engineer; Desire for Improvement; Financial Resources/Insurance; Physical Health; Resilience   Sleep  Sleep:Sleep: Fair    Physical Exam: Physical Exam Vitals and nursing note reviewed.  Constitutional:      General: She is not in acute distress.    Appearance: Normal appearance. She is not ill-appearing.  HENT:     Mouth/Throat:     Pharynx: Oropharynx is clear.  Cardiovascular:     Rate and Rhythm: Normal rate.     Pulses: Normal pulses.  Pulmonary:     Effort: No respiratory distress.  Skin:    General: Skin is dry.  Neurological:     Mental Status: She is alert and oriented to person, place, and time.    Review of Systems  Constitutional: Negative.   Eyes: Negative.   Respiratory: Negative.    Cardiovascular: Negative.  Gastrointestinal: Negative.   Genitourinary: Negative.   Musculoskeletal: Negative.   Skin: Negative.   Neurological:  Positive for tremors. Tingling: Mild. Psychiatric/Behavioral:  Positive for depression and substance abuse. Negative for hallucinations, memory loss and suicidal ideas. The patient is nervous/anxious and has insomnia.    Blood pressure 127/86, pulse 83, temperature 97.8 F (36.6 C), temperature source Oral, resp. rate 18, height 5' 3 (1.6 m), weight 70.3 kg, SpO2 100%. Body mass index is 27.46 kg/m.   Treatment Plan Summary: Daily contact with patient to assess and evaluate symptoms and progress in treatment and Medication management   PLAN: Safety and Monitoring:  --  Voluntary admission to inpatient psychiatric unit for safety, stabilization and treatment  -- Daily contact with patient to assess and evaluate symptoms and progress in treatment  -- Patient's case to be discussed in multi-disciplinary team meeting  -- Observation Level : q15 minute checks  -- Vital signs:  q12 hours  -- Precautions: suicide, elopement, and assault  2. Psychiatric Diagnoses and Treatment:  # Alcohol use disorder # Cocaine use disorder # Substance-induced mood disorder # Posttraumatic stress disorder  - Cymbalta  30 mg daily for PTSD  - Start gabapentin  100 mg 3 times daily, cravings - Hydroxyzine  25 mg 3 times daily as needed, anxiety - Trazodone  50 mg at bedtime as needed, sleep - BH Haldol  agitation protocol (see MAR)  --  The risks/benefits/side-effects/alternatives to this medication were discussed in detail with the patient and time was given for questions. The patient consents to medication trial.  -- FDA  -- Metabolic profile and EKG monitoring obtained while on an atypical antipsychotic (BMI: Lipid Panel: HbgA1c: QTc:)   -- Encouraged patient to participate in unit milieu and in scheduled group therapies   -- Short Term Goals: Ability to identify changes in lifestyle  to reduce recurrence of condition will improve  -- Long Term Goals: Improvement in symptoms so as ready for discharge    3. Medical Issues Being Addressed:  # Alcohol use disorder - CIWA with Ativan  as needed (last CIWA 4 - Multivitamin with minerals 1 tablet daily, Thiamine  100 mg daily - Imodium  2 to 4 mg as needed diarrhea or loose stools - Zofran  disintegrating tablet 4 mg every 6 hours as needed nausea, vomiting  # Cocaine use disorder - Cessation encouraged  # Nicotine  dependence - Nicotine  patch 21 mg/24 hours ordered - Cessation encouraged  # UTI - Bactrim  DS 800-160 mg every 12 hours for 3 days (06/25/24)  4. Discharge Planning:   -- Social work and case management to assist with discharge planning and identification of hospital follow-up needs prior to discharge  -- Estimated LOS: 5-7 days  -- Discharge Concerns: Need to establish a safety plan; Medication compliance and effectiveness  -- Discharge Goals: Return home with outpatient referrals for mental health follow-up including medication management/psychotherapy      I certify that inpatient services furnished can reasonably be expected to improve the patient's condition.   Blair Chiquita Hint, NP 06/26/2024, 9:07 AM Patient ID: [name], female   DOB: Sep 06, 1996, 28 y.o.   MRN: [id number]

## 2024-06-26 NOTE — Progress Notes (Signed)
(  Sleep Hours) -9.25 (Any PRNs that were needed, meds refused, or side effects to meds)- Trazodone  (Any disturbances and when (visitation, over night)-none (Concerns raised by the patient)- none (SI/HI/AVH)-Denied

## 2024-06-27 NOTE — Group Note (Signed)
 Date:  06/27/2024 Time:  10:38 PM  Group Topic/Focus:  Wrap-Up Group:   The focus of this group is to help patients review their daily goal of treatment and discuss progress on daily workbooks.    Participation Level:  Did Not Attend  Participation Quality:  N/A  Affect:  N/A  Cognitive:  N/A  Insight: None  Engagement in Group:  N/A  Modes of Intervention:  N/A  Additional Comments:  Patient was encouraged but did not attend  Eward Mace 06/27/2024, 10:38 PM

## 2024-06-27 NOTE — Plan of Care (Signed)

## 2024-06-27 NOTE — Group Note (Signed)
 Date:  06/27/2024 Time:  5:39 PM  Group Topic/Focus:  Dimensions of Wellness:   The focus of this group is to introduce the topic of wellness and discuss the role each dimension of wellness plays in total health.    Participation Level:  Active  Participation Quality:  Appropriate  Affect:  Appropriate  Cognitive:  Appropriate  Insight: Appropriate  Engagement in Group:  Engaged  Modes of Intervention:  Discussion  Annalee  Elisavet Buehrer 06/27/2024, 5:39 PM

## 2024-06-27 NOTE — [facility] Group Notes (Addendum)
 Spiritual care group on grief and loss facilitated by Chaplain Rockie Sofia, [facility]  Group Goal: Support / Education around grief and loss  Members engage in facilitated group support and psycho-social education.  Group Description:  Following introductions and group rules, group members engaged in facilitated group dialogue and support around topic of loss, with particular support around experiences of loss in their lives. Group Identified types of loss (relationships / self / things) and identified patterns, circumstances, and changes that precipitate losses. Reflected on thoughts / feelings around loss, normalized grief responses, and recognized variety in grief experience. Group encouraged individual reflection on safe space and on the coping skills that they are already utilizing.  Group drew on Adlerian / Rogerian and narrative framework  Patient Progress: Gina Sparks attended group and actively engaged and participated in group conversation and activities. She appeared attentive, calm and supportive of others.

## 2024-06-27 NOTE — Group Note (Signed)
 Date:  06/27/2024 Time:  9:11 PM  Group Topic/Focus:  Wrap-Up Group:   The focus of this group is to help patients review their daily goal of treatment and discuss progress on daily workbooks.    Additional Comments:  Pt attended the NA meeting without any disruptions.   Gina Sparks 06/27/2024, 9:11 PM

## 2024-06-27 NOTE — Group Note (Signed)
 Date:  06/27/2024 Time:  10:29 AM  Group Topic/Focus:  Goals Group:   The focus of this group is to help patients establish daily goals to achieve during treatment and discuss how the patient can incorporate goal setting into their daily lives to aide in recovery.    Participation Level:  Did Not Attend  Participation Quality:  NA  Affect:  NA  Cognitive:  NA  Insight: None  Engagement in Group:  None and NA  Modes of Intervention:  NA  Additional Comments:  NA  Carmine Carrozza A Layloni Fahrner 06/27/2024, 10:29 AM

## 2024-06-27 NOTE — Progress Notes (Signed)
 [name]   Type of Note: Discharge planning  Spoke with patient this afternoon regarding discharge plans for tomorrow.   Pt states she is still not interested in residential treatment and is now planning on staying with a friend, Deward when discharged tomorrow. Also states her car is still at [facility].   Will follow up in the morning when discharge plans are finalized. NP aware.  Signed:  Oaklee Sunga, LCSW-A 06/27/2024  3:33 PM

## 2024-06-27 NOTE — Progress Notes (Signed)
(  Sleep Hours) -9.25 (Any PRNs that were needed, meds refused, or side effects to meds)- prn trazodone  @ 2130 (Any disturbances and when (visitation, over night)-none (Concerns raised by the patient)-none  (SI/HI/AVH)- Denies all

## 2024-06-27 NOTE — Progress Notes (Signed)
   06/27/24 0910  Psych Admission Type (Psych Patients Only)  Admission Status Voluntary  Psychosocial Assessment  Patient Complaints None  Eye Contact Fair  Facial Expression Animated  Affect Anxious  Speech Logical/coherent  Interaction Assertive  Motor Activity Other (Comment) (WDL)  Appearance/Hygiene Unremarkable  Behavior Characteristics Cooperative;Anxious  Mood Pleasant;Anxious  Thought Process  Coherency WDL  Content WDL  Delusions None reported or observed  Perception WDL  Hallucination None reported or observed  Judgment Poor  Confusion None  Danger to Self  Current suicidal ideation? Denies  Agreement Not to Harm Self Yes  Description of Agreement verbal  Danger to Others  Danger to Others None reported or observed

## 2024-06-27 NOTE — Progress Notes (Signed)
   06/27/24 2200  Psych Admission Type (Psych Patients Only)  Admission Status Voluntary  Psychosocial Assessment  Patient Complaints None  Eye Contact Fair  Facial Expression Animated  Affect Anxious  Speech Logical/coherent  Interaction Assertive  Motor Activity Other (Comment) (WNL)  Appearance/Hygiene Unremarkable  Behavior Characteristics Cooperative  Mood Pleasant;Anxious  Thought Process  Coherency WDL  Content WDL  Delusions None reported or observed  Perception WDL  Hallucination None reported or observed  Judgment Poor  Confusion None  Danger to Self  Current suicidal ideation? Denies  Agreement Not to Harm Self Yes  Description of Agreement Verbal  Danger to Others  Danger to Others None reported or observed

## 2024-06-27 NOTE — Progress Notes (Signed)
 [facility] MD Progress Note  06/28/2024 5:43 AM RETHER RISON  MRN:  [id number]  Reason for Admission: Gina Sparks is a 28 y.o., female with history of stimulant use disorder-cocaine, alcohol use disorder, major depressive disorder, PTSD, ADHD, substance-induced bipolar disorder presents to behavioral health hospital requesting assistance to address alcohol dependence.   24-hour chart review: Chart reviewed. Patient's case discussed in interdisciplinary team meeting.  Vital signs reviewed - blood pressure 98/55 (nursing to encourage oral fluid intake).  CIWA-Ar total  2.  As needed trazodone  required overnight.  Subsequently, she slept a documented 9.25 hours.  She is adherent to taking psychotropic medication regimen. Nursing notes indicate Patient observed to be engaged in an active panic attack in milieu. Multiple episodes of emesis, dysregulation, rapid breathing, and tearfulness. Worked with patient on slowing her breathing and grounding herself. Walked patient back to her room to reduce stimulation. PO agitation protocol administered. Patient upset at parents request for a 60 day admission to rehab.     Daily Evaluation: On assessment today, Gina Sparks  reports that her mood is euthymic, improved since admission, and stable. Denies feeling down, depressed, or sad. Reports that anxiety symptoms are at manageable level. Sleep and appetite is stable. Concentration is without complaint. Reports energy level is adequate. Denies having any suicidal thoughts. Denies having any suicidal intent and plan. Denies having any homicidal ideation.  Denies having psychotic symptoms. Denies having side effects to current psychiatric medications. She continues to endorse mild tremors to bilateral hands stating I may just have shaky hands.  Discussed discharge planning and emphasized medication adherence and attendance at follow-up appointments to prevent symptom exacerbation.   We discussed psychosocial stressors,  including past trauma. Discussed the critical importance of complete cessation of alcohol and other substances to support recovery, highlighting that abstaining can lead to improve mood stability, enhance cognitive function, better sleep, and lower anxiety,.  Nursing reports patient is medication compliant and reported her discharge plan is to return to her boyfriend's house and go back to work.   Overall, Gina Sparks's mood appears improved and stable. No overt psychotic symptoms. No manic features.  No withdrawal symptoms or cravings.  Parents reported patient may not return to the home unless she completes a minimum 57-month inpatient substance use treatment program.  Patient declined inpatient treatment, stating no interest, but expressed plan to participate in CD-IOP.  Patient reported intent to reside at friend Gina Sparks home rather than with parents at discharge.  Case reviewed with attending psychiatrist, with plan to continue current treatment regimen. She continues on Bactrim  DS for UTI. Discharge is anticipated on Thursday, June 28, 2024, pending continued stability and absence of new concerns.  Appreciate social work support for CD-IOP and therapy referrals.   Principal Problem: Alcohol use disorder, severe, dependence (HCC) Diagnosis: Principal Problem:   Alcohol use disorder, severe, dependence (HCC) Active Problems:   Generalized anxiety disorder   Major depressive disorder, recurrent episode, severe (HCC)   Severe stimulant use disorder (HCC)   PTSD (post-traumatic stress disorder)  Total Time spent with patient: 35 minutes  Past Psychiatric History: See H&P  Past Medical History:  Past Medical History:  Diagnosis Date   ADHD    Anxiety and depression    History reviewed. No pertinent surgical history. Family History:  Family History  Problem Relation Age of Onset   Anxiety disorder Father    Depression Father    Anxiety disorder Maternal Grandmother    Depression  Maternal Grandmother  Dementia Maternal Grandfather    Breast cancer Paternal Grandmother 33   Dementia Paternal Grandfather    Family Psychiatric  History: As listed above Social History:  Social History   Substance and Sexual Activity  Alcohol Use Yes   Comment: CC:  [facility]     Social History   Substance and Sexual Activity  Drug Use Yes   Types: Marijuana   Comment: CC: [facility] (hx of drugs)    Social History   Socioeconomic History   Marital status: Single    Spouse name: Not on file   Number of children: Not on file   Years of education: Not on file   Highest education level: Not on file  Occupational History   Not on file  Tobacco Use   Smoking status: Some Days    Types: E-cigarettes   Smokeless tobacco: Never  Substance and Sexual Activity   Alcohol use: Yes    Comment: CC:  [facility]   Drug use: Yes    Types: Marijuana    Comment: CC: [facility] (hx of drugs)   Sexual activity: Yes    Birth control/protection: I.U.D.  Other Topics Concern   Not on file  Social History Narrative   Not on file   Social Drivers of Health   Financial Resource Strain: Not on file  Food Insecurity: Food Insecurity Present (06/22/2024)   Hunger Vital Sign    Worried About Running Out of Food in the Last Year: Sometimes true    Ran Out of Food in the Last Year: Sometimes true  Transportation Needs: No Transportation Needs (06/22/2024)   PRAPARE - Administrator, Civil Service (Medical): No    Lack of Transportation (Non-Medical): No  Physical Activity: Unknown (08/06/2019)   Exercise Vital Sign    Days of Exercise per Week: 5 days    Minutes of Exercise per Session: Not on file  Stress: Not on file  Social Connections: Unknown (08/03/2023)   Received from [facility]   Social Network    Social Network: Not on file   Additional Social History:                          Current Medications: Current Facility-Administered Medications  Medication Dose Route  Frequency Provider Last Rate Last Admin   acetaminophen  (TYLENOL ) tablet 650 mg  650 mg Oral Q6H PRN Brent, Amanda C, NP   650 mg at 06/27/24 1003   alum & mag hydroxide-simeth (MAALOX/MYLANTA) 200-200-20 MG/5ML suspension 30 mL  30 mL Oral Q4H PRN Brent, Amanda C, NP       clobetasol  ointment (TEMOVATE ) 0.05 % 1 Application  1 Application Topical BID PRN Lynnette Barter, MD   1 Application at 06/23/24 2109   haloperidol  (HALDOL ) tablet 5 mg  5 mg Oral TID PRN Brent, Amanda C, NP   5 mg at 06/26/24 1539   And   diphenhydrAMINE  (BENADRYL ) capsule 50 mg  50 mg Oral TID PRN Brent, Amanda C, NP   50 mg at 06/26/24 1539   haloperidol  lactate (HALDOL ) injection 5 mg  5 mg Intramuscular TID PRN Brent, Amanda C, NP       And   diphenhydrAMINE  (BENADRYL ) injection 50 mg  50 mg Intramuscular TID PRN Brent, Amanda C, NP       And   LORazepam  (ATIVAN ) injection 2 mg  2 mg Intramuscular TID PRN Brent, Amanda C, NP       haloperidol  lactate (  HALDOL ) injection 10 mg  10 mg Intramuscular TID PRN Brent, Amanda C, NP       And   diphenhydrAMINE  (BENADRYL ) injection 50 mg  50 mg Intramuscular TID PRN Brent, Amanda C, NP       And   LORazepam  (ATIVAN ) injection 2 mg  2 mg Intramuscular TID PRN Brent, Amanda C, NP       DULoxetine  (CYMBALTA ) DR capsule 30 mg  30 mg Oral Daily Ji, Andrew, MD   30 mg at 06/27/24 1003   gabapentin  (NEURONTIN ) capsule 100 mg  100 mg Oral TID Marci Polito H, NP   100 mg at 06/27/24 2106   LORazepam  (ATIVAN ) tablet 1 mg  1 mg Oral Once Prentis Kitchens A, DO       magnesium  hydroxide (MILK OF MAGNESIA) suspension 30 mL  30 mL Oral Daily PRN Brent, Amanda C, NP       multivitamin with minerals tablet 1 tablet  1 tablet Oral Daily Brent, Amanda C, NP   1 tablet at 06/27/24 1003   nicotine  (NICODERM CQ  - dosed in mg/24 hours) patch 21 mg  21 mg Transdermal Q0600 Brent, Amanda C, NP   21 mg at 06/27/24 1000   nicotine  polacrilex (NICORETTE ) gum 2 mg  2 mg Oral PRN Prentis Kitchens A, DO   2 mg  at 06/26/24 1549   sulfamethoxazole -trimethoprim  (BACTRIM  DS) 800-160 MG per tablet 1 tablet  1 tablet Oral Q12H Halea Lieb H, NP   1 tablet at 06/27/24 2106   thiamine  (Vitamin B-1) tablet 100 mg  100 mg Oral Daily Brent, Amanda C, NP   100 mg at 06/27/24 1003   traZODone  (DESYREL ) tablet 50 mg  50 mg Oral QHS PRN Brent, Amanda C, NP   50 mg at 06/27/24 2106    Lab Results: No results found for this or any previous visit (from the past 48 hours).  Blood Alcohol level:  Lab Results  Component Value Date   [facility] <15 06/22/2024   ETH <10 06/18/2018    Metabolic Disorder Labs: Lab Results  Component Value Date   HGBA1C 4.7 (L) 06/22/2024   MPG 88 06/22/2024   No results found for: PROLACTIN Lab Results  Component Value Date   CHOL 179 06/22/2024   TRIG 97 06/22/2024   HDL 107 06/22/2024   CHOLHDL 1.7 06/22/2024   VLDL 19 06/22/2024   LDLCALC 53 06/22/2024    Physical Findings: AIMS:  ,  ,   N/A,  ,  ,  ,   CIWA:  CIWA-Ar Total: 2 COWS:   N/A  Musculoskeletal: Strength & Muscle Tone: within normal limits Gait & Station: normal Patient leans: N/A  Psychiatric Specialty Exam:  Presentation  General Appearance:  Appropriate for Environment; Casual  Eye Contact: Good  Speech: Clear and Coherent  Speech Volume: Normal  Handedness: Right   Mood and Affect  Mood: Euthymic  Affect: Congruent   Thought Process  Thought Processes: Coherent; Goal Directed; Linear  Descriptions of Associations:Intact  Orientation:Full (Time, Place and Person)  Thought Content:Logical  History of Schizophrenia/Schizoaffective disorder:No  Duration of Psychotic Symptoms:No data recorded Hallucinations:Hallucinations: None   Ideas of Reference:None  Suicidal Thoughts:Suicidal Thoughts: No   Homicidal Thoughts:Homicidal Thoughts: No    Sensorium  Memory: Immediate Good; Recent Good; Remote Good  Judgment: Fair  Insight: Fair   Restaurant manager, fast food  Concentration: Good  Attention Span: Good  Recall: Good  Fund of Knowledge: Good  Language: Good   Psychomotor  Activity  Psychomotor Activity:Psychomotor Activity: Normal    Assets  Assets: Communication Skills; Desire for Improvement; Physical Health; Resilience; Social Support; Talents/Skills   Sleep  Sleep:Sleep: Good     Physical Exam: Physical Exam Vitals and nursing note reviewed.  Constitutional:      General: She is not in acute distress.    Appearance: Normal appearance. She is not ill-appearing.  HENT:     Mouth/Throat:     Pharynx: Oropharynx is clear.  Cardiovascular:     Rate and Rhythm: Normal rate.     Pulses: Normal pulses.  Pulmonary:     Effort: No respiratory distress.  Skin:    General: Skin is dry.  Neurological:     Mental Status: She is alert and oriented to person, place, and time.    Review of Systems  Constitutional: Negative.   Eyes: Negative.   Respiratory: Negative.    Cardiovascular: Negative.   Gastrointestinal: Negative.   Genitourinary: Negative.   Musculoskeletal: Negative.   Skin: Negative.   Neurological:  Positive for tremors. Tingling: Mild. Psychiatric/Behavioral:  Positive for depression and substance abuse. Negative for hallucinations, memory loss and suicidal ideas. The patient is nervous/anxious and has insomnia.    Blood pressure 122/63, pulse 87, temperature 97.8 F (36.6 C), temperature source Oral, resp. rate 18, height 5' 3 (1.6 m), weight 70.3 kg, SpO2 100%. Body mass index is 27.46 kg/m.   Treatment Plan Summary: Daily contact with patient to assess and evaluate symptoms and progress in treatment and Medication management   PLAN: Safety and Monitoring:  --  Voluntary admission to inpatient psychiatric unit for safety, stabilization and treatment  -- Daily contact with patient to assess and evaluate symptoms and progress in treatment  -- Patient's case to be discussed in  multi-disciplinary team meeting  -- Observation Level : q15 minute checks  -- Vital signs:  q12 hours  -- Precautions: suicide, elopement, and assault  2. Psychiatric Diagnoses and Treatment:  # Alcohol use disorder # Cocaine use disorder # Substance-induced mood disorder # Posttraumatic stress disorder  - Cymbalta  30 mg daily for PTSD  - Gabapentin  100 mg 3 times daily, cravings - Hydroxyzine  25 mg 3 times daily as needed, anxiety - Trazodone  50 mg at bedtime as needed, sleep - BH Haldol  agitation protocol (see MAR)  --  The risks/benefits/side-effects/alternatives to this medication were discussed in detail with the patient and time was given for questions. The patient consents to medication trial.  -- FDA  -- Metabolic profile and EKG monitoring obtained while on an atypical antipsychotic (BMI: Lipid Panel: HbgA1c: QTc:)   -- Encouraged patient to participate in unit milieu and in scheduled group therapies   -- Short Term Goals: Ability to identify changes in lifestyle to reduce recurrence of condition will improve  -- Long Term Goals: Improvement in symptoms so as ready for discharge    3. Medical Issues Being Addressed:  # Alcohol use disorder - CIWA with Ativan  as needed (last CIWA 4 - Multivitamin with minerals 1 tablet daily, Thiamine  100 mg daily - Imodium  2 to 4 mg as needed diarrhea or loose stools - Zofran  disintegrating tablet 4 mg every 6 hours as needed nausea, vomiting  # Cocaine use disorder - Cessation encouraged  # Nicotine  dependence - Nicotine  patch 21 mg/24 hours ordered - Cessation encouraged  # UTI - Bactrim  DS 800-160 mg every 12 hours for 3 days (06/25/24)  4. Discharge Planning:   -- Social work and case management to  assist with discharge planning and identification of hospital follow-up needs prior to discharge  -- Estimated LOS: 5-7 days  -- Discharge Concerns: Need to establish a safety plan; Medication compliance and effectiveness  --  Discharge Goals: Return home with outpatient referrals for mental health follow-up including medication management/psychotherapy      I certify that inpatient services furnished can reasonably be expected to improve the patient's condition.   Blair Chiquita Hint, NP 06/28/2024, 5:43 AM Patient ID: Gina Sparks, female   DOB: 01-04-1996, 28 y.o.   MRN: [id number] Patient ID: Gina Sparks, female   DOB: 05-26-1996, 28 y.o.   MRN: [id number]

## 2024-06-28 DIAGNOSIS — F1520 Other stimulant dependence, uncomplicated: Secondary | ICD-10-CM

## 2024-06-28 DIAGNOSIS — F4310 Post-traumatic stress disorder, unspecified: Secondary | ICD-10-CM

## 2024-06-28 DIAGNOSIS — F332 Major depressive disorder, recurrent severe without psychotic features: Principal | ICD-10-CM

## 2024-06-28 DIAGNOSIS — F1020 Alcohol dependence, uncomplicated: Secondary | ICD-10-CM

## 2024-06-28 DIAGNOSIS — F411 Generalized anxiety disorder: Secondary | ICD-10-CM

## 2024-06-28 MED ORDER — DULOXETINE HCL 30 MG PO CPEP: 30.0000 mg | ORAL_CAPSULE | Freq: Every day | ORAL | 0 refills | Status: AC

## 2024-06-28 NOTE — Progress Notes (Signed)
(  Sleep Hours) - 9.25 (Any PRNs that were needed, meds refused, or side effects to meds)- traz prn meds, no meds refused, no side effects to meds  (Any disturbances and when (visitation, over night)- n/a  (Concerns raised by the patient)- n/a  (SI/HI/AVH)- denies

## 2024-06-28 NOTE — [facility] Group Notes (Signed)
 Adult Psychoeducational Group Note  Date:  06/28/2024 Time:  9:50 AM  Group Topic/Focus:  Goals Group:   The focus of this group is to help patients establish daily goals to achieve during treatment and discuss how the patient can incorporate goal setting into their daily lives to aide in recovery.  Orientation:   The focus of this group is to educate the patient on the purpose and policies of crisis stabilization and provide a format to answer questions about their admission.  The group details unit policies and expectations of patients while admitted.  Participation Level:  Did Not Attend   Additional Comments:  Pt was invited but did not attend orientation/goals group.  Niel CHRISTELLA Nightingale 06/28/2024, 9:50 AM

## 2024-06-28 NOTE — Progress Notes (Signed)
   06/28/24 0800  Psych Admission Type (Psych Patients Only)  Admission Status Voluntary  Psychosocial Assessment  Patient Complaints None  Eye Contact Fair  Facial Expression Animated  Affect Appropriate to circumstance  Speech Logical/coherent  Interaction Assertive  Motor Activity Other (Comment) (level 3 observationi)  Appearance/Hygiene Unremarkable  Behavior Characteristics Cooperative  Mood Pleasant;Anxious  Thought Process  Coherency WDL  Content WDL  Delusions None reported or observed  Perception WDL  Hallucination None reported or observed  Judgment Poor  Confusion None  Danger to Self  Current suicidal ideation? Denies  Agreement Not to Harm Self Yes  Danger to Others  Danger to Others None reported or observed

## 2024-06-28 NOTE — [facility] Suicide Risk Assessment (Signed)
 [facility] INPATIENT:  Family/Significant Other Suicide Prevention Education  Suicide Prevention Education:  Education Completed; Deward DASEN. (friend) [phone],  (name of family member/significant other) has been identified by the patient as the family member/significant other with whom the patient will be residing, and identified as the person(s) who will aid the patient in the event of a mental health crisis (suicidal ideations/suicide attempt).  With written consent from the patient, the family member/significant other has been provided the following suicide prevention education, prior to the and/or following the discharge of the patient.  Deward does not have any concerns with patient discharging to him home today. No access to weapons or firearms.   The suicide prevention education provided includes the following: Suicide risk factors Suicide prevention and interventions National Suicide Hotline telephone number [facility] assessment telephone number [facility] Emergency Assistance 911 [facility] and/or Residential Mobile Crisis Unit telephone number  Request made of family/significant other to: Remove weapons (e.g., guns, rifles, knives), all items previously/currently identified as safety concern.   Remove drugs/medications (over-the-counter, prescriptions, illicit drugs), all items previously/currently identified as a safety concern.  The family member/significant other verbalizes understanding of the suicide prevention education information provided.  The family member/significant other agrees to remove the items of safety concern listed above.  Jenkins LULLA Primer 06/28/2024, 8:56 AM

## 2024-06-28 NOTE — Progress Notes (Addendum)
  [facility] Adult Case Management Discharge Plan :  Will you be returning to the same living situation after discharge:  No. At discharge, do you have transportation home?: Yes,  CSW arranged safe transport to [facility] for 11AM for patient to retrieve her car. Pt reports she will then go to friends house Do you have the ability to pay for your medications: Yes,  pt has active healthcare insurance coverage  Release of information consent forms completed and in the chart;  Patient's signature needed at discharge.  Patient to Follow up at:  Follow-up Information     [facility] Follow up.   Why: Please contact your provider, [name] if you wish to schedule an appointment, as we were unable to contact prior to your discharge. Contact information: [address] [place], [place] [address]  Phone Number: [phone]  Fax Number: [phone]        [facility] Follow up on 07/05/2024.   Why: You have a hospital follow up appointment for therapy and medication management services on  9/11 at 12:30 pm .  The appointment will be Virtual, telehealth. Contact information: [address]  [address] [place] [address] [phone]         [facility]. Call.   Specialty: Behavioral Health Why: Please call this provider if you wish to receive services for the substance abuse intensive outpatient therapy program. A referral has been submitted on your behalf. Contact information: [address] [place]  [id number] [phone]                Next level of care provider has access to [facility] Link:no  Safety Planning and Suicide Prevention discussed: Yes,  Deward LANES (friend) [phone]     Has patient been referred to the Quitline?: Patient refused referral for treatment  Patient has been referred for addiction treatment: Information for [facility] IOP given to patient through [facility]. Pt encouraged to call to set up appointment.  Jenkins LULLA Primer, LCSWA 06/28/2024, 9:16 AM

## 2024-06-28 NOTE — BHH Suicide Risk Assessment (Signed)
 [facility] Discharge Suicide Risk Assessment   Principal Problem: Alcohol use disorder, severe, dependence (HCC) Discharge Diagnoses: Principal Problem:   Alcohol use disorder, severe, dependence (HCC) Active Problems:   Generalized anxiety disorder   Major depressive disorder, recurrent episode, severe (HCC)   Severe stimulant use disorder (HCC)   PTSD (post-traumatic stress disorder)   Total Time spent with patient: 45 minutes  Demographic Factors:  Adolescent or young adult and Caucasian  Loss Factors: Financial problems/change in socioeconomic status  Historical Factors: Impulsivity  Risk Reduction Factors:   Living with another person, especially a relative and Positive social support  Continued Clinical Symptoms:  Substance use disorder, in early remission; PTSD; Anxiety; Depression  Cognitive Features That Contribute To Risk:  None    Suicide Risk:  Mild:  Suicidal ideation of limited frequency, intensity, duration, and specificity.  There are no identifiable plans, no associated intent, mild dysphoria and related symptoms, good self-control (both objective and subjective assessment), few other risk factors, and identifiable protective factors, including available and accessible social support.   Follow-up Information     [facility] Follow up.   Why: Please contact your provider, Gina Sparks if you wish to schedule an appointment, as we were unable to contact prior to your discharge. Contact information: [address], [place], [place] [address]  Phone Number: [phone]  Fax Number: [phone]        [facility] Follow up on 07/05/2024.   Why: You have a hospital follow up appointment for therapy and medication management services on  9/11 at 12:30 pm .  The appointment will be Virtual, telehealth. Contact information: [address]  [address] [place] [address] [phone]                 Plan Of Care/Follow-up recommendations:  See  DC summary  Gina DELENA Salmon, DO 06/28/2024, 8:51 AM

## 2024-06-28 NOTE — Plan of Care (Signed)
   Problem: Education: Goal: Emotional status will improve Outcome: Progressing Goal: Mental status will improve Outcome: Progressing Goal: Verbalization of understanding the information provided will improve Outcome: Progressing

## 2024-06-28 NOTE — Progress Notes (Signed)
Pt discharged to lobby. Pt was stable and appreciative at that time. All papers and prescriptions were given and valuables returned. Suicide safety plan completed. Verbal understanding expressed. Denies SI/HI and A/VH. Pt given opportunity to express concerns and ask questions.
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
## 2024-07-19 ENCOUNTER — Encounter: Payer: Self-pay | Admitting: Dermatology

## 2024-07-19 ENCOUNTER — Ambulatory Visit: Payer: MEDICAID | Admitting: Dermatology

## 2024-07-19 DIAGNOSIS — Z7189 Other specified counseling: Secondary | ICD-10-CM

## 2024-07-19 DIAGNOSIS — Z79899 Other long term (current) drug therapy: Secondary | ICD-10-CM

## 2024-07-19 DIAGNOSIS — L2084 Intrinsic (allergic) eczema: Secondary | ICD-10-CM

## 2024-07-19 DIAGNOSIS — L209 Atopic dermatitis, unspecified: Secondary | ICD-10-CM | POA: Diagnosis not present

## 2024-07-19 NOTE — Progress Notes (Addendum)
   Follow-Up Visit   Subjective  Gina Sparks is a 28 y.o. female who presents for the following: Atopic Dermatitis face, neck scalp, trunk, extremities, 71m f/u, Dupixent  sq injections, clobetasol  oint prn face/body qowk, eczema improved, pt did stop drinking and has started seeing a psychiatrist   The following portions of the chart were reviewed this encounter and updated as appropriate: medications, allergies, medical history  Review of Systems:  No other skin or systemic complaints except as noted in HPI or Assessment and Plan.  Objective  Well appearing patient in no apparent distress; mood and affect are within normal limits.   A focused examination was performed of the following areas: Face, trunk, extremities  Relevant exam findings are noted in the Assessment and Plan.    Assessment & Plan   ATOPIC DERMATITIS, significantly improved but still flaring, present since infancy, severe, recalcitrant, near erythrodermic, harming quality of life, causing difficulty sleeping and functioning Tried and failed Tacrolimus ointment, Clobetasol  ointment, mycophenolate, prednisone , methylprednisolone , dexamethasone , hydrocortisone  Face, scalp, trunk, extremities Exam: Scaly pink lichenified papules coalescing to plaques AC popliteal fossae, arms, legs 75% BSA improved to 15% on Dupixent   Chronic and persistent condition with duration or expected duration over one year. Condition is improving with treatment but not currently at goal.   Atopic dermatitis - Severe, on Dupixent  (biologic medication).  Atopic dermatitis (eczema) is a chronic, relapsing, pruritic condition that can significantly affect quality of life. It is often associated with allergic rhinitis and/or asthma and can require treatment with topical medications, phototherapy, or in severe cases biologic medications, which require long term medication management.    Treatment Plan: Cont Dupixent  300mg /31ml sq injecitons q 2  wks Cont Clobetasol  oint qd/bid aa trunk extremities prn flares, avoid f/g/a Pt has been approved and is awaiting shipment, sample of Dupixent  300mg /33ml x 2 injections given today until she can get next shipment worked out, Lot [id number], exp 09/23/2026  Recommend gentle skin care.   Dupilumab  (Dupixent ) is a biologic treatment given by injection for adults and children with moderate-to-severe atopic dermatitis. Goal is control of skin condition, not cure. It is given as 2 injections at the first dose followed by 1 injection every 2 weeks thereafter.  Young children are dosed monthly.  Potential side effects include allergic reaction, herpes infections, injection site reactions and conjunctivitis (inflammation of the eyes).  The use of Dupixent  requires long term medication management, including periodic office visits.   Topical steroids (such as triamcinolone, fluocinolone, fluocinonide, mometasone, clobetasol , halobetasol, betamethasone, hydrocortisone ) can cause thinning and lightening of the skin if they are used for too long in the same area. Your physician has selected the right strength medicine for your problem and area affected on the body. Please use your medication only as directed by your physician to prevent side effects.   INTRINSIC ATOPIC DERMATITIS   LONG-TERM USE OF HIGH-RISK MEDICATION   COUNSELING AND COORDINATION OF CARE   MEDICATION MANAGEMENT    Return in about 6 months (around 01/16/2025) for Atopic Derm.  I, Grayce Saunas, RMA, am acting as scribe for Boneta Sharps, MD .   Documentation: I have reviewed the above documentation for accuracy and completeness, and I agree with the above.  Boneta Sharps, MD

## 2024-07-19 NOTE — Patient Instructions (Addendum)
 Apply for disability online [email or web address]  Due to recent changes in healthcare laws, you may see results of your pathology and/or laboratory studies on MyChart before the doctors have had a chance to review them. We understand that in some cases there may be results that are confusing or concerning to you. Please understand that not all results are received at the same time and often the doctors may need to interpret multiple results in order to provide you with the best plan of care or course of treatment. Therefore, we ask that you please give us  2 business days to thoroughly review all your results before contacting the office for clarification. Should we see a critical lab result, you will be contacted sooner.   If You Need Anything After Your Visit  If you have any questions or concerns for your doctor, please call our main line at [phone] and press option 4 to reach your doctor's medical assistant. If no one answers, please leave a voicemail as directed and we will return your call as soon as possible. Messages left after 4 pm will be answered the following business day.   You may also send us  a message via MyChart. We typically respond to MyChart messages within 1-2 business days.  For prescription refills, please ask your pharmacy to contact our office. Our fax number is [phone].  If you have an urgent issue when the clinic is closed that cannot wait until the next business day, you can page your doctor at the number below.    Please note that while we do our best to be available for urgent issues outside of office hours, we are not available 24/7.   If you have an urgent issue and are unable to reach us , you may choose to seek medical care at your doctor's office, retail clinic, urgent care center, or emergency room.  If  you have a medical emergency, please immediately call 911 or go to the emergency department.  Pager Numbers  - Dr. Hester: [phone]  - Dr. Jackquline: [phone]  - Dr. Claudene: [phone]   - Dr. Raymund: [phone]  In the event of inclement weather, please call our main line at [phone] for an update on the status of any delays or closures.  Dermatology Medication Tips: Please keep the boxes that topical medications come in in order to help keep track of the instructions about where and how to use these. Pharmacies typically print the medication instructions only on the boxes and not directly on the medication tubes.   If your medication is too expensive, please contact our office at [phone] option 4 or send us  a message through MyChart.   We are unable to tell what your co-pay for medications will be in advance as this is different depending on your insurance coverage. However, we may be able to find a substitute medication at lower cost or fill out paperwork to get insurance to cover a needed medication.   If a prior authorization is required to get your medication covered by your insurance company, please allow us  1-2 business days to complete this process.  Drug prices often vary depending on where the prescription is filled and some pharmacies may offer cheaper prices.  The website www.goodrx.com contains coupons for medications through different pharmacies. The prices here do not account for what the cost may be with help from insurance (it may be cheaper with your insurance), but the website can give you the price if you did not use  any insurance.  - You can print the associated coupon and take it with your prescription to the pharmacy.  - You may also stop by our office during regular business hours and pick up a GoodRx coupon card.  - If you need your prescription sent electronically to a different pharmacy, notify our office through [facility] or by  phone at [phone] option 4.     Si Usted Necesita Algo Despus de Su Visita  Tambin puede enviarnos un mensaje a travs de Clinical cytogeneticist. Por lo general respondemos a los mensajes de MyChart en el transcurso de 1 a 2 das hbiles.  Para renovar recetas, por favor pida a su farmacia que se ponga en contacto con nuestra oficina. Randi lakes de fax es [place] [phone].  Si tiene un asunto urgente cuando la clnica est cerrada y que no puede esperar hasta el siguiente da hbil, puede llamar/localizar a su doctor(a) al nmero que aparece a continuacin.   Por favor, tenga en cuenta que aunque hacemos todo lo posible para estar disponibles para asuntos urgentes fuera del horario de [place], no estamos disponibles las 24 horas del da, los 7 [address]  [address] de la [place].   Si tiene un problema urgente y no puede comunicarse con nosotros, puede optar por buscar atencin mdica  en el consultorio de su doctor(a), en una clnica privada, en un centro de atencin urgente o en una sala de emergencias.  Si tiene Engineer, drilling, por favor llame inmediatamente al 911 o vaya a la sala de emergencias.  Nmeros de bper  - Dr. Hester: [phone]  - Dra. Jackquline: [phone]  - Dr. Claudene: [phone]  - Dra. Kitts: [phone]  En caso de inclemencias del [place], por favor llame a nuestra lnea principal al [phone] para una actualizacin sobre el estado de cualquier retraso o cierre.  Consejos para la medicacin en dermatologa: Por favor, guarde las cajas en las que vienen los medicamentos de uso tpico para ayudarle a seguir las instrucciones sobre dnde y cmo usarlos. Las farmacias generalmente imprimen las instrucciones del medicamento slo en las cajas y no directamente en los tubos del [place].   Si su medicamento es muy caro, por favor, pngase en contacto con landry rieger llamando al [phone] y presione la opcin 4 o envenos un mensaje a travs de Clinical cytogeneticist.   No  podemos decirle cul ser su copago por los medicamentos por adelantado ya que esto es diferente dependiendo de la cobertura de su seguro. Sin embargo, es posible que podamos encontrar un medicamento sustituto a Audiological scientist un formulario para que el seguro cubra el medicamento que se considera necesario.   Si se requiere una autorizacin previa para que su compaa de seguros malta su medicamento, por favor permtanos de 1 a 2 das hbiles para completar este proceso.  Los precios de los medicamentos varan con frecuencia dependiendo del Environmental consultant de dnde se surte la receta y alguna farmacias pueden ofrecer precios ms baratos.  El sitio web www.goodrx.com tiene cupones para medicamentos de Health and safety inspector. Los precios aqu no tienen en cuenta lo que podra costar con la ayuda del seguro (puede ser ms barato con su seguro), pero el sitio web puede darle el precio si no utiliz Tourist information centre manager.  - Puede imprimir el cupn correspondiente y llevarlo con su receta a la farmacia.  - Tambin puede pasar por nuestra oficina durante el horario de atencin regular y Education officer, museum una tarjeta de cupones de GoodRx.  - Si necesita que su  receta se enve electrnicamente a una farmacia diferente, informe a nuestra oficina a travs de MyChart de [place] o por telfono llamando al [phone] y presione la opcin 4.

## 2024-07-23 ENCOUNTER — Encounter: Payer: Self-pay | Admitting: Dermatology

## 2025-01-17 ENCOUNTER — Ambulatory Visit: Payer: MEDICAID | Admitting: Dermatology
